# Patient Record
Sex: Female | Born: 1995 | Race: Black or African American | Hispanic: No | Marital: Single | Smoking: Never smoker
Health system: Southern US, Community
[De-identification: ages and names within clinical notes are randomized; demographics above are authoritative.]

## PROBLEM LIST (undated history)

## (undated) DIAGNOSIS — L509 Urticaria, unspecified: Secondary | ICD-10-CM

## (undated) HISTORY — DX: Urticaria, unspecified: L50.9

---
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
## 2015-03-03 ENCOUNTER — Ambulatory Visit (INDEPENDENT_AMBULATORY_CARE_PROVIDER_SITE_OTHER): Payer: 59 | Admitting: Certified Nurse Midwife

## 2015-03-03 ENCOUNTER — Encounter: Payer: Self-pay | Admitting: Certified Nurse Midwife

## 2015-03-03 VITALS — BP 115/72 | HR 76 | Temp 98.5°F | Ht 65.0 in | Wt 162.0 lb

## 2015-03-03 DIAGNOSIS — D649 Anemia, unspecified: Secondary | ICD-10-CM | POA: Insufficient documentation

## 2015-03-03 DIAGNOSIS — D500 Iron deficiency anemia secondary to blood loss (chronic): Secondary | ICD-10-CM

## 2015-03-03 DIAGNOSIS — N644 Mastodynia: Secondary | ICD-10-CM | POA: Diagnosis not present

## 2015-03-03 NOTE — Progress Notes (Signed)
Patient ID: Ashley Burch, female   DOB: [birth year], 19 y.o.   MRN: [id number]   Chief Complaint  Patient presents with  . Follow-up    problems/concerns    HPI Ashley Burch is a 19 y.o. female.  C/O breast tenderness and pruritis.  Had recently been to the beach with a sunburn and is currently peeling on right breast.   Declines STD screening exam.  Denies being currently sexually active.  Declines birth control at this time.  States periods last about 4 days denies dysmenorrhea or AUB.  HPI  History reviewed. No pertinent past medical history.  History reviewed. No pertinent past surgical history.  Family History  Problem Relation Age of Onset  . Cancer Maternal Grandmother   . Kidney disease Maternal Grandfather     Social History Social History  Substance Use Topics  . Smoking status: Never Smoker   . Smokeless tobacco: None  . Alcohol Use: No    No Known Allergies  Current Outpatient Prescriptions  Medication Sig Dispense Refill  . azithromycin (ZITHROMAX) 1 G powder Take 1 packet (1 g total) by mouth once. (Patient not taking: Reported on 09/04/2014) 1 each 0  . ibuprofen (ADVIL,MOTRIN) 200 MG tablet Take 200 mg by mouth every 6 (six) hours as needed for mild pain.    . naproxen (NAPROSYN) 500 MG tablet Take 1 tablet (500 mg total) by mouth 2 (two) times daily with a meal. (Patient not taking: Reported on 03/03/2015) 30 tablet 0   No current facility-administered medications for this visit.    Review of Systems Review of Systems Constitutional: negative for fatigue and weight loss Respiratory: negative for cough and wheezing Cardiovascular: negative for chest pain, fatigue and palpitations Gastrointestinal: negative for abdominal pain and change in bowel habits Genitourinary:negative Integument/breast: negative for nipple discharge Musculoskeletal:negative for myalgias Neurological: negative for gait problems and tremors Behavioral/Psych: negative for abusive  relationship, depression Endocrine: negative for temperature intolerance     Blood pressure 115/72, pulse 76, temperature 98.5 F (36.9 C), height  (1.651 m), weight 162 lb (73.483 kg), last menstrual period 02/23/2015.  Physical Exam Physical Exam General:   alert  Skin:   no rash or abnormalities  Lungs:   clear to auscultation bilaterally  Heart:   regular rate and rhythm, S1, S2 normal, no murmur, click, rub or gallop  Breasts:   normal without suspicious masses, skin or nipple changes or axillary nodes  Abdomen:  normal findings: no organomegaly, soft, non-tender and no hernia  Pelvis:  deferred    90% of 15 min visit spent on counseling and coordination of care.   Data Reviewed Previous medical hx, labs, meds  Assessment     Normal breast development Anemia     Plan   Professional bra fitting.   No orders of the defined types were placed in this encounter.   No orders of the defined types were placed in this encounter.    Follow up as needed or if desires birth control.

## 2015-05-26 ENCOUNTER — Encounter: Payer: Self-pay | Admitting: Certified Nurse Midwife

## 2015-05-26 ENCOUNTER — Ambulatory Visit (INDEPENDENT_AMBULATORY_CARE_PROVIDER_SITE_OTHER): Payer: 59 | Admitting: Certified Nurse Midwife

## 2015-05-26 VITALS — BP 121/68 | HR 77 | Temp 98.6°F | Wt 157.0 lb

## 2015-05-26 DIAGNOSIS — Z7251 High risk heterosexual behavior: Secondary | ICD-10-CM

## 2015-05-26 DIAGNOSIS — Z30011 Encounter for initial prescription of contraceptive pills: Secondary | ICD-10-CM

## 2015-05-26 DIAGNOSIS — Z3202 Encounter for pregnancy test, result negative: Secondary | ICD-10-CM | POA: Diagnosis not present

## 2015-05-26 DIAGNOSIS — N760 Acute vaginitis: Secondary | ICD-10-CM | POA: Diagnosis not present

## 2015-05-26 DIAGNOSIS — Z309 Encounter for contraceptive management, unspecified: Secondary | ICD-10-CM | POA: Insufficient documentation

## 2015-05-26 LAB — POCT URINE PREGNANCY: Preg Test, Ur: NEGATIVE

## 2015-05-26 LAB — POCT URINALYSIS DIPSTICK
Bilirubin, UA: NEGATIVE
Glucose, UA: NEGATIVE
Ketones, UA: NEGATIVE
Leukocytes, UA: NEGATIVE
Nitrite, UA: NEGATIVE
Protein, UA: NEGATIVE
Spec Grav, UA: 1.01
Urobilinogen, UA: NEGATIVE
pH, UA: 7

## 2015-05-26 MED ORDER — FLUCONAZOLE 100 MG PO TABS: 100.0000 mg | ORAL_TABLET | Freq: Every day | ORAL | Status: DC

## 2015-05-26 MED ORDER — TERCONAZOLE 0.8 % VA CREA: 1.0000 | TOPICAL_CREAM | Freq: Every day | VAGINAL | Status: DC

## 2015-05-26 MED ORDER — NORETHIN-ETH ESTRAD-FE BIPHAS 1 MG-10 MCG / 10 MCG PO TABS: 1.0000 | ORAL_TABLET | Freq: Every day | ORAL | Status: DC

## 2015-05-26 NOTE — Progress Notes (Signed)
Patient ID: Ashley Burch, female   DOB: 08/05/1995, 19 y.o.   MRN: [id number]  Chief Complaint  Patient presents with  . Vaginitis    patient complains vaginal itching x 2 days, new partner    HPI Ashley Burch is a 19 y.o. female.  States she has vaginal itching for several days.  Has not tried anything.  Desires treatment.  Is sexually active and is not using contraception.  Counseling given.  Desires to start contraception.  Has never been on contraception.  States that she can take pills and will remember.  Counseled that if that changes to let us know so that we can change forms of contraception.  Also, states that her breasts are not sore currently and is doing better since having a bra fitting.     HPI  No past medical history on file.  No past surgical history on file.  Family History  Problem Relation Age of Onset  . Cancer Maternal Grandmother   . Kidney disease Maternal Grandfather     Social History Social History  Substance Use Topics  . Smoking status: Never Smoker   . Smokeless tobacco: None  . Alcohol Use: No    No Known Allergies  Current Outpatient Prescriptions  Medication Sig Dispense Refill  . fluconazole (DIFLUCAN) 100 MG tablet Take 1 tablet (100 mg total) by mouth daily. 3 tablet 0  . ibuprofen (ADVIL,MOTRIN) 200 MG tablet Take 200 mg by mouth every 6 (six) hours as needed for mild pain.    . naproxen (NAPROSYN) 500 MG tablet Take 1 tablet (500 mg total) by mouth 2 (two) times daily with a meal. (Patient not taking: Reported on 03/03/2015) 30 tablet 0  . Norethindrone-Ethinyl Estradiol-Fe Biphas (LO LOESTRIN FE) 1 MG-10 MCG / 10 MCG tablet Take 1 tablet by mouth daily. 1 Package 11  . terconazole (TERAZOL 3) 0.8 % vaginal cream Place 1 applicator vaginally at bedtime. 20 g 0   No current facility-administered medications for this visit.    Review of Systems Review of Systems Constitutional: negative for fatigue and weight loss Respiratory: negative  for cough and wheezing Cardiovascular: negative for chest pain, fatigue and palpitations Gastrointestinal: negative for abdominal pain and change in bowel habits Genitourinary:negative Integument/breast: negative for nipple discharge Musculoskeletal:negative for myalgias Neurological: negative for gait problems and tremors Behavioral/Psych: negative for abusive relationship, depression Endocrine: negative for temperature intolerance     Blood pressure 121/68, pulse 77, temperature 98.6 F (37 C), temperature source Oral, weight 157 lb (71.215 kg), last menstrual period 05/19/2015.  Physical Exam Physical Exam General:   alert  Skin:   no rash or abnormalities  Lungs:   clear to auscultation bilaterally  Heart:   regular rate and rhythm, S1, S2 normal, no murmur, click, rub or gallop  Breasts:   deferred  Abdomen:  normal findings: no organomegaly, soft, non-tender and no hernia  Pelvis:  External genitalia: normal general appearance Urinary system: urethral meatus normal and bladder without fullness, nontender Vaginal: normal without tenderness, induration or masses Cervix: normal appearance Adnexa: normal bimanual exam Uterus: anteverted and non-tender, normal size    50% of 15 min visit spent on counseling and coordination of care.   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 POCT Urinalysis Dipstick  . POCT urine pregnancy   Meds  ordered this encounter  Medications  . Norethindrone-Ethinyl Estradiol-Fe Biphas (LO LOESTRIN FE) 1 MG-10 MCG / 10 MCG tablet    Sig: Take 1 tablet by mouth daily.    Dispense:  1 Package    Refill:  11  . fluconazole (DIFLUCAN) 100 MG tablet    Sig: Take 1 tablet (100 mg total) by mouth daily.    Dispense:  3 tablet    Refill:  0  . terconazole (TERAZOL 3) 0.8 % vaginal cream    Sig: Place 1  applicator vaginally at bedtime.    Dispense:  20 g    Refill:  0    Need to obtain previous records Possible management options include: LARK Follow up as in 3 months for contraception management.

## 2015-05-26 NOTE — Addendum Note (Signed)
Addended by: Marya LandryFOSTER, SUZANNE D on: 05/26/2015 04:57 PM   Modules accepted: Orders

## 2015-06-01 LAB — SURESWAB, VAGINOSIS/VAGINITIS PLUS
ATOPOBIUM VAGINAE: NOT DETECTED Log (cells/mL)
C. ALBICANS, DNA: DETECTED — AB
C. GLABRATA, DNA: NOT DETECTED
C. parapsilosis, DNA: NOT DETECTED
C. trachomatis RNA, TMA: NOT DETECTED
C. tropicalis, DNA: NOT DETECTED
GARDNERELLA VAGINALIS: 5.4 Log (cells/mL)
LACTOBACILLUS SPECIES: 7.4 Log (cells/mL)
MEGASPHAERA SPECIES: NOT DETECTED Log (cells/mL)
N. gonorrhoeae RNA, TMA: NOT DETECTED
T. VAGINALIS RNA, QL TMA: NOT DETECTED

## 2015-06-03 ENCOUNTER — Other Ambulatory Visit: Payer: Self-pay | Admitting: Certified Nurse Midwife

## 2015-07-15 ENCOUNTER — Telehealth: Payer: Self-pay | Admitting: *Deleted

## 2015-07-15 NOTE — Telephone Encounter (Signed)
Patient called- but did not leave reason for call 1:15 LM on VM- call on call if emergent- otherwise office will open Tuesday

## 2015-07-28 DIAGNOSIS — Z23 Encounter for immunization: Secondary | ICD-10-CM | POA: Diagnosis not present

## 2015-08-26 ENCOUNTER — Ambulatory Visit: Payer: 59 | Admitting: Certified Nurse Midwife

## 2015-09-14 ENCOUNTER — Emergency Department (HOSPITAL_COMMUNITY)
Admission: EM | Admit: 2015-09-14 | Discharge: 2015-09-14 | Disposition: A | Discharge status: Home / Self Care | Payer: 59 | Attending: Emergency Medicine | Admitting: Emergency Medicine

## 2015-09-14 ENCOUNTER — Emergency Department (HOSPITAL_COMMUNITY): Payer: 59

## 2015-09-14 ENCOUNTER — Encounter (HOSPITAL_COMMUNITY): Payer: Self-pay | Admitting: *Deleted

## 2015-09-14 DIAGNOSIS — Y9389 Activity, other specified: Secondary | ICD-10-CM | POA: Diagnosis not present

## 2015-09-14 DIAGNOSIS — S0083XA Contusion of other part of head, initial encounter: Secondary | ICD-10-CM | POA: Diagnosis not present

## 2015-09-14 DIAGNOSIS — Z3202 Encounter for pregnancy test, result negative: Secondary | ICD-10-CM | POA: Diagnosis not present

## 2015-09-14 DIAGNOSIS — S0081XA Abrasion of other part of head, initial encounter: Secondary | ICD-10-CM | POA: Insufficient documentation

## 2015-09-14 DIAGNOSIS — M542 Cervicalgia: Secondary | ICD-10-CM

## 2015-09-14 DIAGNOSIS — S199XXA Unspecified injury of neck, initial encounter: Secondary | ICD-10-CM | POA: Diagnosis not present

## 2015-09-14 DIAGNOSIS — R519 Headache, unspecified: Secondary | ICD-10-CM

## 2015-09-14 DIAGNOSIS — Z793 Long term (current) use of hormonal contraceptives: Secondary | ICD-10-CM | POA: Insufficient documentation

## 2015-09-14 DIAGNOSIS — Y92410 Unspecified street and highway as the place of occurrence of the external cause: Secondary | ICD-10-CM | POA: Diagnosis not present

## 2015-09-14 DIAGNOSIS — Z79899 Other long term (current) drug therapy: Secondary | ICD-10-CM | POA: Diagnosis not present

## 2015-09-14 DIAGNOSIS — R51 Headache: Secondary | ICD-10-CM

## 2015-09-14 DIAGNOSIS — S0990XA Unspecified injury of head, initial encounter: Secondary | ICD-10-CM | POA: Diagnosis not present

## 2015-09-14 DIAGNOSIS — Y998 Other external cause status: Secondary | ICD-10-CM | POA: Diagnosis not present

## 2015-09-14 LAB — POC URINE PREG, ED: PREG TEST UR: NEGATIVE

## 2015-09-14 MED ORDER — GI COCKTAIL ~~LOC~~: 30.0000 mL | Freq: Once | ORAL | Status: DC

## 2015-09-14 MED ORDER — METHOCARBAMOL 500 MG PO TABS: 500.0000 mg | ORAL_TABLET | Freq: Two times a day (BID) | ORAL | Status: DC

## 2015-09-14 MED ORDER — ACETAMINOPHEN 325 MG PO TABS
650.0000 mg | ORAL_TABLET | Freq: Once | ORAL | Status: AC
  Administered 2015-09-14: 650 mg via ORAL
  Filled 2015-09-14: qty 2

## 2015-09-14 NOTE — ED Notes (Signed)
Pt stable, ambulatory, states understanding of discharge instructions 
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
## 2015-09-14 NOTE — ED Notes (Signed)
Pt is neurologically intact.

## 2015-10-27 ENCOUNTER — Ambulatory Visit (INDEPENDENT_AMBULATORY_CARE_PROVIDER_SITE_OTHER): Payer: 59 | Admitting: Certified Nurse Midwife

## 2015-10-27 ENCOUNTER — Encounter: Payer: Self-pay | Admitting: Certified Nurse Midwife

## 2015-10-27 VITALS — BP 118/81 | HR 94 | Wt 152.0 lb

## 2015-10-27 DIAGNOSIS — N760 Acute vaginitis: Secondary | ICD-10-CM

## 2015-10-27 DIAGNOSIS — Z3041 Encounter for surveillance of contraceptive pills: Secondary | ICD-10-CM | POA: Diagnosis not present

## 2015-10-27 DIAGNOSIS — Z7251 High risk heterosexual behavior: Secondary | ICD-10-CM

## 2015-10-27 DIAGNOSIS — Z711 Person with feared health complaint in whom no diagnosis is made: Secondary | ICD-10-CM

## 2015-10-27 DIAGNOSIS — N949 Unspecified condition associated with female genital organs and menstrual cycle: Secondary | ICD-10-CM | POA: Diagnosis not present

## 2015-10-27 DIAGNOSIS — N898 Other specified noninflammatory disorders of vagina: Secondary | ICD-10-CM | POA: Diagnosis not present

## 2015-10-27 MED ORDER — FLUCONAZOLE 100 MG PO TABS: 100.0000 mg | ORAL_TABLET | Freq: Once | ORAL | Status: DC

## 2015-10-27 MED ORDER — NORETHIN-ETH ESTRAD-FE BIPHAS 1 MG-10 MCG / 10 MCG PO TABS: 1.0000 | ORAL_TABLET | Freq: Every day | ORAL | Status: DC

## 2015-10-27 MED ORDER — VALACYCLOVIR HCL 1 G PO TABS: 1000.0000 mg | ORAL_TABLET | Freq: Two times a day (BID) | ORAL | Status: DC

## 2015-10-27 NOTE — Patient Instructions (Signed)
Herpes Simplex Test There are two common types of herpes simplex virus (HSV). These are classified as Type 1 (HSV1) or Type 2 (HSV2). Type 1 often causes cold sores on or around the mouth and sometimes on or around the eyes. Type 2 is commonly known as a sexually transmitted infection that causes sores in and around the genitals. Both types of herpes simplex can cause sores in different areas. There are two types of herpes simplex tests. These include:  Culture. This consists of collecting and testing a sample of fluid with a cotton swab from an open sore. This can only be done during an active infection (outbreak). Culture tests take several days to complete but are very accurate.  HSV blood tests. This test is not as accurate as a culture. However, HSV blood tests are faster than cultures, often providing a test result within one day.  HSV antibody test. This checks for the presence of antibodies against HSV in your blood. Antibodies are proteins your body makes to help fight infection.  HSV antigen test. This checks for the presence of the HSV germ (antigen) in your blood. Your health care provider may recommend you have a HSV test if:  He or she believes you have a HSV infection.  You have a weakened immune system (immunocompromised) and you have sores around your mouth or genitals that look like HSV eruptions.  You have a fever of unknown origin (FUO).  You are pregnant, have herpes, and are expecting to deliver a baby vaginally in the next 6-8 weeks. RESULTS It is your responsibility to obtain your test results. Ask the lab or department performing the test when and how you will get your results. Contact your health care provider to discuss any questions you have about your results. Range of Normal Values Ranges for normal values may vary among different labs and hospitals. You should always check with your health care provider after having lab work or other tests done to discuss whether  your values are considered within normal limits. Normal findings include:  No HSV antigen or antibodies present in your blood.  No HSV antigen present in cultured fluid. Meaning of Results Outside Normal Ranges The following test results may indicate that you have an active HSV infection:  Positive culture for HSV1 or HSV2.  Presence of HSV1 or HSV2 antigens in your blood.  Presence of certain HSV1 or HSV2 antibodies (IgM) in your blood. Discuss your test results with your health care provider. He or she will use the results to make a diagnosis and determine a treatment plan that is right for you.   This information is not intended to replace advice given to you by your health care provider. Make sure you discuss any questions you have with your health care provider.   Document Released: 08/11/2004 Document Revised: 07/30/2014 Document Reviewed: 11/24/2013 Elsevier Interactive Patient Education 2016 [employer] Oral Contraception Information Oral contraceptive pills (OCPs) are medicines taken to prevent pregnancy. OCPs work by preventing the ovaries from releasing eggs. The hormones in OCPs also cause the cervical mucus to thicken, preventing the sperm from entering the uterus. The hormones also cause the uterine lining to become thin, not allowing a fertilized egg to attach to the inside of the uterus. OCPs are highly effective when taken exactly as prescribed. However, OCPs do not prevent sexually transmitted diseases (STDs). Safe sex practices, such as using condoms along with the pill, can help prevent STDs.  Before taking the pill, you may  have a physical exam and Pap test. Your health care provider may order blood tests. The health care provider will make sure you are a good candidate for oral contraception. Discuss with your health care provider the possible side effects of the OCP you may be prescribed. When starting an OCP, it can take 2 to 3 months for the body to adjust to the  changes in hormone levels in your body.  TYPES OF ORAL CONTRACEPTION  The combination pill--This pill contains estrogen and progestin (synthetic progesterone) hormones. The combination pill comes in 21-day, 28-day, or 91-day packs. Some types of combination pills are meant to be taken continuously (365-day pills). With 21-day packs, you do not take pills for 7 days after the last pill. With 28-day packs, the pill is taken every day. The last 7 pills are without hormones. Certain types of pills have more than 21 hormone-containing pills. With 91-day packs, the first 84 pills contain both hormones, and the last 7 pills contain no hormones or contain estrogen only.  The minipill--This pill contains the progesterone hormone only. The pill is taken every day continuously. It is very important to take the pill at the same time each day. The minipill comes in packs of 28 pills. All 28 pills contain the hormone.  ADVANTAGES OF ORAL CONTRACEPTIVE PILLS  Decreases premenstrual symptoms.   Treats menstrual period cramps.   Regulates the menstrual cycle.   Decreases a heavy menstrual flow.   May treatacne, depending on the type of pill.   Treats abnormal uterine bleeding.   Treats polycystic ovarian syndrome.   Treats endometriosis.   Can be used as emergency contraception.  THINGS THAT CAN MAKE ORAL CONTRACEPTIVE PILLS LESS EFFECTIVE OCPs can be less effective if:   You forget to take the pill at the same time every day.   You have a stomach or intestinal disease that lessens the absorption of the pill.   You take OCPs with other medicines that make OCPs less effective, such as antibiotics, certain HIV medicines, and some seizure medicines.   You take expired OCPs.   You forget to restart the pill on day 7, when using the packs of 21 pills.  RISKS ASSOCIATED WITH ORAL CONTRACEPTIVE PILLS  Oral contraceptive pills can sometimes cause side effects, such  as:  Headache.  Nausea.  Breast tenderness.  Irregular bleeding or spotting. Combination pills are also associated with a small increased risk of:  Blood clots.  Heart attack.  Stroke.   This information is not intended to replace advice given to you by your health care provider. Make sure you discuss any questions you have with your health care provider.   Document Released: 09/29/2002 Document Revised: 04/29/2013 Document Reviewed: 12/28/2012 Elsevier Interactive Patient Education 2016 [employer]. Safe Sex Safe sex is about reducing the risk of giving or getting a sexually transmitted disease (STD). STDs are spread through sexual contact involving the genitals, mouth, or rectum. Some STDs can be cured and others cannot. Safe sex can also prevent unintended pregnancies.  WHAT ARE SOME SAFE SEX PRACTICES?  Limit your sexual activity to only one partner who is having sex with only you.  Talk to your partner about his or her past partners, past STDs, and drug use.  Use a condom every time you have sexual intercourse. This includes vaginal, oral, and anal sexual activity. Both females and males should wear condoms during oral sex. Only use latex or polyurethane condoms and water-based lubricants. Using petroleum-based lubricants or  oils to lubricate a condom will weaken the condom and increase the chance that it will break. The condom should be in place from the beginning to the end of sexual activity. Wearing a condom reduces, but does not completely eliminate, your risk of getting or giving an STD. STDs can be spread by contact with infected body fluids and skin.  Get vaccinated for hepatitis B and HPV.  Avoid alcohol and recreational drugs, which can affect your judgment. You may forget to use a condom or participate in high-risk sex.  For females, avoid douching after sexual intercourse. Douching can spread an infection farther into the reproductive tract.  Check your body  for signs of sores, blisters, rashes, or unusual discharge. See your health care provider if you notice any of these signs.  Avoid sexual contact if you have symptoms of an infection or are being treated for an STD. If you or your partner has herpes, avoid sexual contact when blisters are present. Use condoms at all other times.  If you are at risk of being infected with HIV, it is recommended that you take a prescription medicine daily to prevent HIV infection. This is called pre-exposure prophylaxis (PrEP). You are considered at risk if:  You are a man who has sex with other men (MSM).  You are a heterosexual man or woman who is sexually active with more than one partner.  You take drugs by injection.  You are sexually active with a partner who has HIV.  Talk with your health care provider about whether you are at high risk of being infected with HIV. If you choose to begin PrEP, you should first be tested for HIV. You should then be tested every 3 months for as long as you are taking PrEP.  See your health care provider for regular screenings, exams, and tests for other STDs. Before having sex with a new partner, each of you should be screened for STDs and should talk about the results with each other. WHAT ARE THE BENEFITS OF SAFE SEX?   There is less chance of getting or giving an STD.  You can prevent unwanted or unintended pregnancies.  By discussing safe sex concerns with your partner, you may increase feelings of intimacy, comfort, trust, and honesty between the two of you.   This information is not intended to replace advice given to you by your health care provider. Make sure you discuss any questions you have with your health care provider.   Document Released: 08/16/2004 Document Revised: 07/30/2014 Document Reviewed: 12/31/2011 Elsevier Interactive Patient Education [employer]

## 2015-10-27 NOTE — Addendum Note (Signed)
Addended by: Marya LandryFOSTER, SUZANNE D on: 10/27/2015 02:16 PM   Modules accepted: Orders

## 2015-10-27 NOTE — Progress Notes (Signed)
Patient ID: Ashley Burch, female   DOB: [birth year], 20 y.o.   MRN: [id number]  Chief Complaint  Patient presents with  . Gynecologic Exam    bumps on vaginal area x 2 days    HPI Ashley Burch is a 20 y.o. female.  States that the sores appereard two days ago, very painful, hard to sit.  Tender to palpation, oozing ulcers.  Does have puritis. Has not had this issue before.  Denies any hx of herpes.  Boyfriend present for exam, he has a cold sore currently.  Discussed sexual transmission of Herpes in detail.  Encouraged boyfriend to have STD screening performed also.  Encouraged safe sex practices.  Discussed ways to prevent transmission.  Is currently sexually active, not currently taking her OCPs, states that she ran out.  Was taking them on a daily basis.  Is not using condoms presently.  Desires to continue with LoLo.    HPI  No past medical history on file.  No past surgical history on file.  Family History  Problem Relation Age of Onset  . Cancer Maternal Grandmother   . Kidney disease Maternal Grandfather     Social History Social History  Substance Use Topics  . Smoking status: Never Smoker   . Smokeless tobacco: Not on file  . Alcohol Use: No    No Known Allergies  Current Outpatient Prescriptions  Medication Sig Dispense Refill  . ibuprofen (ADVIL,MOTRIN) 200 MG tablet Take 200 mg by mouth every 6 (six) hours as needed for mild pain.    . methocarbamol (ROBAXIN) 500 MG tablet Take 1 tablet (500 mg total) by mouth 2 (two) times daily. (Patient not taking: Reported on 10/27/2015) 10 tablet 0  . naproxen (NAPROSYN) 500 MG tablet Take 1 tablet (500 mg total) by mouth 2 (two) times daily with a meal. (Patient not taking: Reported on 03/03/2015) 30 tablet 0  . Norethindrone-Ethinyl Estradiol-Fe Biphas (LO LOESTRIN FE) 1 MG-10 MCG / 10 MCG tablet Take 1 tablet by mouth daily. 1 Package 11  . valACYclovir (VALTREX) 1000 MG tablet Take 1 tablet (1,000 mg total) by mouth 2 (two)  times daily. 30 tablet PRN   No current facility-administered medications for this visit.    Review of Systems Review of Systems Constitutional: negative for fatigue and weight loss Respiratory: negative for cough and wheezing Cardiovascular: negative for chest pain, fatigue and palpitations Gastrointestinal: negative for abdominal pain and change in bowel habits Genitourinary: + labial/vaginal leisions Integument/breast: negative for nipple discharge Musculoskeletal:negative for myalgias Neurological: negative for gait problems and tremors Behavioral/Psych: negative for abusive relationship, depression Endocrine: negative for temperature intolerance     Blood pressure 118/81, pulse 94, weight 152 lb (68.947 kg), last menstrual period 10/10/2015.  Physical Exam Physical Exam General:   alert  Skin:   no rash or abnormalities  Lungs:   clear to auscultation bilaterally  Heart:   regular rate and rhythm, S1, S2 normal, no murmur, click, rub or gallop  Breasts:   deferred  Abdomen:  normal findings: no organomegaly, soft, non-tender and no hernia  Pelvis:  External genitalia: normal general appearance, + multiple ulcerations oozing leisions about 2 mm in size along external labia and the introitus, erythemic Urinary system: urethral meatus normal and bladder without fullness, nontender Vaginal: normal without tenderness, induration or masses, + white chunky discharge noted    50% of 25 min visit spent on counseling and coordination of care.   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 HIV antibody (with reflex)  . Hepatitis B surface antigen  . RPR  . Hepatitis C antibody   Meds ordered this encounter  Medications  . Norethindrone-Ethinyl Estradiol-Fe  Biphas (LO LOESTRIN FE) 1 MG-10 MCG / 10 MCG tablet    Sig: Take 1 tablet by mouth daily.    Dispense:  1 Package    Refill:  11  . valACYclovir (VALTREX) 1000 MG tablet    Sig: Take 1 tablet (1,000 mg total) by mouth 2 (two) times daily.    Dispense:  30 tablet    Refill:  PRN      Follow up as needed.

## 2015-10-28 LAB — HIV ANTIBODY (ROUTINE TESTING W REFLEX): HIV SCREEN 4TH GENERATION: NONREACTIVE

## 2015-10-28 LAB — RPR: RPR: NONREACTIVE

## 2015-10-28 LAB — HEPATITIS B SURFACE ANTIGEN: Hepatitis B Surface Ag: NEGATIVE

## 2015-10-28 LAB — HEPATITIS C ANTIBODY

## 2015-10-30 ENCOUNTER — Encounter: Payer: Self-pay | Admitting: Certified Nurse Midwife

## 2015-10-31 ENCOUNTER — Other Ambulatory Visit: Payer: Self-pay | Admitting: Certified Nurse Midwife

## 2015-10-31 LAB — NUSWAB VAGINITIS PLUS (VG+)
CANDIDA ALBICANS, NAA: POSITIVE — AB
Candida glabrata, NAA: NEGATIVE
Chlamydia trachomatis, NAA: NEGATIVE
NEISSERIA GONORRHOEAE, NAA: NEGATIVE
Trich vag by NAA: NEGATIVE

## 2015-11-01 ENCOUNTER — Other Ambulatory Visit: Payer: Self-pay | Admitting: Certified Nurse Midwife

## 2015-11-01 DIAGNOSIS — A6009 Herpesviral infection of other urogenital tract: Secondary | ICD-10-CM | POA: Insufficient documentation

## 2015-11-01 DIAGNOSIS — N760 Acute vaginitis: Secondary | ICD-10-CM

## 2015-11-01 LAB — HERPES SIMPLEX VIRUS CULTURE

## 2015-11-01 MED ORDER — VALACYCLOVIR HCL 500 MG PO TABS: 500.0000 mg | ORAL_TABLET | Freq: Every day | ORAL | Status: DC

## 2015-11-01 MED ORDER — IBUPROFEN 800 MG PO TABS: 800.0000 mg | ORAL_TABLET | Freq: Three times a day (TID) | ORAL | Status: DC | PRN

## 2015-11-01 MED ORDER — TERCONAZOLE 0.4 % VA CREA: 1.0000 | TOPICAL_CREAM | Freq: Every day | VAGINAL | Status: DC

## 2016-03-14 ENCOUNTER — Encounter: Payer: Self-pay | Admitting: Certified Nurse Midwife

## 2016-03-16 ENCOUNTER — Other Ambulatory Visit: Payer: Self-pay | Admitting: *Deleted

## 2016-03-16 DIAGNOSIS — A6009 Herpesviral infection of other urogenital tract: Secondary | ICD-10-CM

## 2016-03-16 MED ORDER — VALACYCLOVIR HCL 500 MG PO TABS: 500.0000 mg | ORAL_TABLET | Freq: Every day | ORAL | 4 refills | Status: DC

## 2016-06-21 ENCOUNTER — Other Ambulatory Visit: Payer: Self-pay | Admitting: Certified Nurse Midwife

## 2016-06-21 ENCOUNTER — Telehealth: Payer: Self-pay | Admitting: *Deleted

## 2016-06-21 DIAGNOSIS — Z3041 Encounter for surveillance of contraceptive pills: Secondary | ICD-10-CM

## 2016-06-21 MED ORDER — NORETHIN-ETH ESTRAD-FE BIPHAS 1 MG-10 MCG / 10 MCG PO TABS: 1.0000 | ORAL_TABLET | Freq: Every day | ORAL | 4 refills | Status: DC

## 2016-06-21 NOTE — Telephone Encounter (Signed)
Refill sent.  Thank you.  R.Tessy Pawelski CNM 

## 2016-06-21 NOTE — Telephone Encounter (Signed)
Fax received from pharmacy for 90 day supply of Lo Loestrin.  Please send to pharmacy if approved for 90 day fill.

## 2016-09-27 ENCOUNTER — Encounter: Payer: Self-pay | Admitting: Obstetrics & Gynecology

## 2016-09-27 ENCOUNTER — Ambulatory Visit (INDEPENDENT_AMBULATORY_CARE_PROVIDER_SITE_OTHER): Payer: Self-pay | Admitting: Obstetrics & Gynecology

## 2016-09-27 VITALS — BP 116/74 | HR 98 | Wt 158.0 lb

## 2016-09-27 DIAGNOSIS — Z113 Encounter for screening for infections with a predominantly sexual mode of transmission: Secondary | ICD-10-CM

## 2016-09-27 DIAGNOSIS — N898 Other specified noninflammatory disorders of vagina: Secondary | ICD-10-CM

## 2016-09-27 NOTE — Progress Notes (Signed)
   GYNECOLOGY OFFICE VISIT NOTE  History:  21 y.o. F here today for vaginal discharge and odor for a month now. She denies any abnormal vaginal bleeding, pelvic pain or other concerns.   No past medical history on file.  No past surgical history on file.  The following portions of the patient's history were reviewed and updated as appropriate: allergies, current medications, past family history, past medical history, past social history, past surgical history and problem list.    Review of Systems:  Pertinent items noted in HPI and remainder of comprehensive ROS otherwise negative.   Objective:  Physical Exam BP 116/74   Pulse 98   Wt 158 lb (71.7 kg)   LMP 09/10/2016 (Exact Date)   BMI 26.29 kg/m  CONSTITUTIONAL: Well-developed, well-nourished female in no acute distress.  HENT:  Normocephalic, atraumatic. External right and left ear normal. Oropharynx is clear and moist EYES: Conjunctivae and EOM are normal. Pupils are equal, round, and reactive to light. No scleral icterus.  NECK: Normal range of motion, supple, no masses SKIN: Skin is warm and dry. No rash noted. Not diaphoretic. No erythema. No pallor. NEUROLOGIC: Alert and oriented to person, place, and time. Normal reflexes, muscle tone coordination. No cranial nerve deficit noted. PSYCHIATRIC: Normal mood and affect. Normal behavior. Normal judgment and thought content. CARDIOVASCULAR: Normal heart rate noted RESPIRATORY: Effort and breath sounds normal, no problems with respiration noted ABDOMEN: Soft, no distention noted.   PELVIC: Normal appearing external genitalia; normal appearing vaginal mucosa and cervix. Scant discharge noted; testing sample obtained.  Normal uterine size, no other palpable masses, no uterine or adnexal tenderness. MUSCULOSKELETAL: Normal range of motion. No edema noted.  Assessment & Plan:  1. Vaginal discharge Cervicovaginal ancillary only done, will follow up results and manage  accordingly.  Routine preventative health maintenance measures emphasized. Please refer to After Visit Summary for other counseling recommendations.   Return if symptoms worsen or fail to improve.   Total face-to-face time with patient: 15 minutes. Over 50% of encounter was spent on counseling and coordination of care.   Jaynie CollinsUGONNA  Hiliana Eilts, MD, FACOG Attending Obstetrician & Gynecologist, [facility], [facility]

## 2016-09-27 NOTE — Patient Instructions (Signed)
Return to clinic for any scheduled appointments or for any gynecologic concerns as needed.     Vaginitis Vaginitis is an inflammation of the vagina. It is most often caused by a change in the normal balance of the bacteria and yeast that live in the vagina. This change in balance causes an overgrowth of certain bacteria or yeast, which causes the inflammation. There are different types of vaginitis, but the most common types are:  Bacterial vaginosis.  Yeast infection (candidiasis).  Trichomoniasis vaginitis. This is a sexually transmitted infection (STI).  Viral vaginitis.  Atrophic vaginitis.  Allergic vaginitis. What are the causes? The cause depends on the type of vaginitis. Vaginitis can be caused by:  Bacteria (bacterial vaginosis).  Yeast (yeast infection).  A parasite (trichomoniasis vaginitis)  A virus (viral vaginitis).  Low hormone levels (atrophic vaginitis). Low hormone levels can occur during pregnancy, breastfeeding, or after menopause.  Irritants, such as bubble baths, scented tampons, and feminine sprays (allergic vaginitis). Other factors can change the normal balance of the yeast and bacteria that live in the vagina. These include:  Antibiotic medicines.  Poor hygiene.  Diaphragms, vaginal sponges, spermicides, birth control pills, and intrauterine devices (IUD).  Sexual intercourse.  Infection.  Uncontrolled diabetes.  A weakened immune system. What are the signs or symptoms? Symptoms can vary depending on the cause of the vaginitis. Common symptoms include:  Abnormal vaginal discharge.  The discharge is white, gray, or yellow with bacterial vaginosis.  The discharge is thick, white, and cheesy with a yeast infection.  The discharge is frothy and yellow or greenish with trichomoniasis.  A bad vaginal odor.  The odor is fishy with bacterial vaginosis.  Vaginal itching, pain, or swelling.  Painful intercourse.  Pain or burning when  urinating. Sometimes there are no symptoms. How is this treated? Treatment will vary depending on the type of infection.  Bacterial vaginosis and trichomoniasis are often treated with antibiotic creams or pills.  Yeast infections are often treated with antifungal medicines, such as vaginal creams or suppositories.  Viral vaginitis has no cure, but symptoms can be treated with medicines that relieve discomfort. Your sexual partner should be treated as well.  Atrophic vaginitis may be treated with an estrogen cream, pill, suppository, or vaginal ring. If vaginal dryness occurs, lubricants and moisturizing creams may help. You may be told to avoid scented soaps, sprays, or douches.  Allergic vaginitis treatment involves quitting the use of the product that is causing the problem. Vaginal creams can be used to treat the symptoms. Follow these instructions at home:  Take all medicines as directed by your caregiver.  Keep your genital area clean and dry. Avoid soap and only rinse the area with water.  Avoid douching. It can remove the healthy bacteria in the vagina.  Do not use tampons or have sexual intercourse until your vaginitis has been treated. Use sanitary pads while you have vaginitis.  Wipe from front to back. This avoids the spread of bacteria from the rectum to the vagina.  Let air reach your genital area. ? Wear cotton underwear to decrease moisture buildup.  Avoid wearing underwear while you sleep until your vaginitis is gone.  Avoid tight pants and underwear or nylons without a cotton panel.  Take off wet clothing (especially bathing suits) as soon as possible.  Use mild, non-scented products. Avoid using irritants, such as:  Scented feminine sprays.  Fabric softeners.  Scented detergents.  Scented tampons.  Scented soaps or bubble baths.  Practice safe  sex and use condoms. Condoms may prevent the spread of trichomoniasis and viral vaginitis. Contact a health  care provider if:  You have abdominal pain.  You have symptoms that last for more than 2-3 days.  You have a fever and your symptoms suddenly get worse. This information is not intended to replace advice given to you by your health care provider. Make sure you discuss any questions you have with your health care provider. Document Released: 05/06/2007 Document Revised: 05/30/2016 Document Reviewed: 05/30/2016 Elsevier Interactive Patient Education  2017 [employer].

## 2016-09-28 LAB — CERVICOVAGINAL ANCILLARY ONLY
Bacterial vaginitis: NEGATIVE
CHLAMYDIA, DNA PROBE: NEGATIVE
Candida vaginitis: NEGATIVE
Neisseria Gonorrhea: NEGATIVE
TRICH (WINDOWPATH): NEGATIVE

## 2017-05-22 ENCOUNTER — Encounter (HOSPITAL_COMMUNITY): Payer: Self-pay | Admitting: Emergency Medicine

## 2017-05-22 ENCOUNTER — Ambulatory Visit (HOSPITAL_COMMUNITY)
Admission: EM | Admit: 2017-05-22 | Discharge: 2017-05-22 | Disposition: A | Discharge status: Home / Self Care | Payer: BLUE CROSS/BLUE SHIELD | Attending: Family Medicine | Admitting: Family Medicine

## 2017-05-22 ENCOUNTER — Ambulatory Visit (INDEPENDENT_AMBULATORY_CARE_PROVIDER_SITE_OTHER): Payer: BLUE CROSS/BLUE SHIELD

## 2017-05-22 DIAGNOSIS — S82839A Other fracture of upper and lower end of unspecified fibula, initial encounter for closed fracture: Secondary | ICD-10-CM | POA: Diagnosis not present

## 2017-05-22 DIAGNOSIS — S82832A Other fracture of upper and lower end of left fibula, initial encounter for closed fracture: Secondary | ICD-10-CM

## 2017-05-22 DIAGNOSIS — M25572 Pain in left ankle and joints of left foot: Secondary | ICD-10-CM

## 2017-05-22 NOTE — ED Provider Notes (Signed)
[facility]    CSN: [id number] Arrival date & time: 05/22/17  1503     History   Chief Complaint Chief Complaint  Patient presents with  . Ankle Pain    HPI Ashley Burch is a 21 y.o. female.   21 year old female states she was at school today descending stairs and she twisted her left ankle. She did not fall. Complaining of pain primarily to the medial and lateral aspect of the ankle where there is swelling and tenderness. Denies other injury.      History reviewed. No pertinent past medical history.  Patient Active Problem List   Diagnosis Date Noted  . Herpes genitalis in women 11/01/2015  . Vaginal lesion 10/27/2015  . Contraception management 05/26/2015  . High risk sexual behavior 05/26/2015  . Vaginitis 05/26/2015  . Anemia 03/03/2015    History reviewed. No pertinent surgical history.  OB History    No data available       Home Medications    Prior to Admission medications   Medication Sig Start Date End Date Taking? Authorizing Provider  valACYclovir (VALTREX) 500 MG tablet Take 1 tablet (500 mg total) by mouth daily. 03/16/16   Roe Coombsenney, Rachelle A, CNM    Family History Family History  Problem Relation Age of Onset  . Cancer Maternal Grandmother   . Kidney disease Maternal Grandfather     Social History Social History  Substance Use Topics  . Smoking status: Never Smoker  . Smokeless tobacco: Never Used  . Alcohol use No     Allergies   Patient has no known allergies.   Review of Systems Review of Systems  Constitutional: Negative for activity change, chills and fever.  HENT: Negative.   Respiratory: Negative.   Musculoskeletal:       As per HPI  Skin: Negative for color change, pallor and rash.  Neurological: Negative.   All other systems reviewed and are negative.    Physical Exam Triage Vital Signs ED Triage Vitals  Enc Vitals Group     BP 05/22/17 1517 129/82     Pulse Rate 05/22/17 1517 91     Resp  05/22/17 1517 18     Temp 05/22/17 1517 98.3 F (36.8 C)     Temp Source 05/22/17 1517 Oral     SpO2 05/22/17 1517 100 %     Weight --      Height --      Head Circumference --      Peak Flow --      Pain Score 05/22/17 1518 10     Pain Loc --      Pain Edu? --      Excl. in GC? --    No data found.   Updated Vital Signs BP 129/82 (BP Location: Right Arm)   Pulse 91   Temp 98.3 F (36.8 C) (Oral)   Resp 18   LMP 05/14/2017 (Exact Date)   SpO2 100%   Visual Acuity Right Eye Distance:   Left Eye Distance:   Bilateral Distance:    Right Eye Near:   Left Eye Near:    Bilateral Near:     Physical Exam  Constitutional: She is oriented to person, place, and time. She appears well-developed and well-nourished. No distress.  HENT:  Head: Normocephalic and atraumatic.  Eyes: EOM are normal.  Neck: Normal range of motion. Neck supple.  Pulmonary/Chest: Effort normal.  Musculoskeletal: She exhibits edema and tenderness. She exhibits no deformity.  Bimalleolar tenderness. Swelling to surrounding malleolar soft tissue. Limited range of motion due to pain. Minor tenderness to the dorsum of the proximal foot. No swelling or deformity to the foot. Distal neurovascular and sensory grossly intact. Pedal pulses 1+. Normal warmth and color.  Neurological: She is alert and oriented to person, place, and time. No cranial nerve deficit.  Skin: Skin is warm and dry.  Psychiatric: She has a normal mood and affect.  Nursing note and vitals reviewed.    UC Treatments / Results  Labs (all labs ordered are listed, but only abnormal results are displayed) Labs Reviewed - No data to display  EKG  EKG Interpretation None       Radiology Dg Ankle Complete Left  Result Date: 05/22/2017 CLINICAL DATA:  Inversion injury 2 hours ago.  Pain and swelling. EXAM: LEFT ANKLE COMPLETE - 3+ VIEW COMPARISON:  None. FINDINGS: Lateral soft tissue swelling. Tiny avulsion fracture at the tip of the  fibula. Tiny bone density at the base of the fifth metatarsal could also represent a tiny avulsion. IMPRESSION: Lateral soft tissue swelling. Tiny avulsion fracture of the tip of the fibula. Possible tiny avulsion fracture at the base of the fifth metatarsal as well. Electronically Signed   By: Paulina Fusi M.D.   On: 05/22/2017 15:41    Procedures Procedures (including critical care time)  Medications Ordered in UC Medications - No data to display   Initial Impression / Assessment and Plan / UC Course  I have reviewed the triage vital signs and the nursing notes.  Pertinent labs & imaging results that were available during my care of the patient were reviewed by me and considered in my medical decision making (see chart for details).       Final Clinical Impressions(s) / UC Diagnoses   Final diagnoses:  Acute left ankle pain  Avulsion fracture of distal fibula    New Prescriptions Current Discharge Medication List       Controlled Substance Prescriptions  Controlled Substance Registry consulted? Not Applicable   Hayden Rasmussen, NP 05/22/17 [phone]

## 2017-05-22 NOTE — Discharge Instructions (Signed)
Rest, ice, elevation and wear the wrap for at least 7-10 days. He may take it off periodically as needed. Do not place weight on your ankle for at least 3-5 days. If you notice there is still pain at that time replacing some weight and decrease amount of weight applied. Use crutches. No jumping, running type activity for at least 2-1/2-3 weeks. The orthopedic on-call is listed on this page him a call for follow-up appointment as needed.

## 2017-05-22 NOTE — ED Notes (Signed)
Pt given ice pack for comfort.  

## 2017-05-22 NOTE — ED Triage Notes (Signed)
Pt here with left ankle pain and swelling after twisting today

## 2017-05-31 ENCOUNTER — Ambulatory Visit (INDEPENDENT_AMBULATORY_CARE_PROVIDER_SITE_OTHER): Payer: BLUE CROSS/BLUE SHIELD | Admitting: Obstetrics

## 2017-05-31 ENCOUNTER — Encounter: Payer: Self-pay | Admitting: Obstetrics

## 2017-05-31 VITALS — BP 136/86 | Ht 65.0 in | Wt 157.0 lb

## 2017-05-31 DIAGNOSIS — Z113 Encounter for screening for infections with a predominantly sexual mode of transmission: Secondary | ICD-10-CM

## 2017-05-31 DIAGNOSIS — N898 Other specified noninflammatory disorders of vagina: Secondary | ICD-10-CM | POA: Diagnosis not present

## 2017-05-31 DIAGNOSIS — Z3041 Encounter for surveillance of contraceptive pills: Secondary | ICD-10-CM

## 2017-05-31 DIAGNOSIS — Z01419 Encounter for gynecological examination (general) (routine) without abnormal findings: Secondary | ICD-10-CM | POA: Diagnosis not present

## 2017-05-31 DIAGNOSIS — Z124 Encounter for screening for malignant neoplasm of cervix: Secondary | ICD-10-CM

## 2017-05-31 DIAGNOSIS — N926 Irregular menstruation, unspecified: Secondary | ICD-10-CM

## 2017-05-31 DIAGNOSIS — Z3202 Encounter for pregnancy test, result negative: Secondary | ICD-10-CM

## 2017-05-31 LAB — POCT URINE PREGNANCY: Preg Test, Ur: NEGATIVE

## 2017-05-31 MED ORDER — NORETHIN-ETH ESTRAD-FE BIPHAS 1 MG-10 MCG / 10 MCG PO TABS: 1.0000 | ORAL_TABLET | Freq: Every day | ORAL | 11 refills | Status: AC

## 2017-05-31 NOTE — Progress Notes (Signed)
Subjective:        Eugene GarnetKeira S Kelliher is a 21 y.o. female here for a routine exam.  Current complaints: Vaginal discharge without odor or irritation.    Personal health questionnaire:  Is patient Ashkenazi Jewish, have a family history of breast and/or ovarian cancer: no Is there a family history of uterine cancer diagnosed at age < 7450, gastrointestinal cancer, urinary tract cancer, family member who is a Personnel officerLynch syndrome-associated carrier: no Is the patient overweight and hypertensive, family history of diabetes, personal history of gestational diabetes, preeclampsia or PCOS: no Is patient over 4255, have PCOS,  family history of premature CHD under age 21, diabetes, smoke, have hypertension or peripheral artery disease:  no At any time, has a partner hit, kicked or otherwise hurt or frightened you?: no Over the past 2 weeks, have you felt down, depressed or hopeless?: no Over the past 2 weeks, have you felt little interest or pleasure in doing things?:no   Gynecologic History Patient's last menstrual period was 04/14/2017. Contraception: none Last Pap: n/a. Results were: n/a Last mammogram: n/a. Results were: n/a  Obstetric History OB History  Gravida Para Term Preterm AB Living  0 0 0 0 0 0  SAB TAB Ectopic Multiple Live Births  0 0 0 0 0        History reviewed. No pertinent past medical history.  History reviewed. No pertinent surgical history.   Current Outpatient Medications:  .  Norethindrone-Ethinyl Estradiol-Fe Biphas (LO LOESTRIN FE) 1 MG-10 MCG / 10 MCG tablet, Take 1 tablet daily by mouth. Start taking pills on the first day of period., Disp: 1 Package, Rfl: 11 .  valACYclovir (VALTREX) 500 MG tablet, Take 1 tablet (500 mg total) by mouth daily. (Patient not taking: Reported on 05/31/2017), Disp: 30 tablet, Rfl: 4 No Known Allergies  Social History   Tobacco Use  . Smoking status: Never Smoker  . Smokeless tobacco: Never Used  Substance Use Topics  . Alcohol use:  No    Family History  Problem Relation Age of Onset  . Cancer Maternal Grandmother   . Kidney disease Maternal Grandfather       Review of Systems  Constitutional: negative for fatigue and weight loss Respiratory: negative for cough and wheezing Cardiovascular: negative for chest pain, fatigue and palpitations Gastrointestinal: negative for abdominal pain and change in bowel habits Musculoskeletal:negative for myalgias Neurological: negative for gait problems and tremors Behavioral/Psych: negative for abusive relationship, depression Endocrine: negative for temperature intolerance    Genitourinary:positive for vaginal discharge Integument/breast: negative for breast lump, breast tenderness, nipple discharge and skin lesion(s)    Objective:       BP 136/86   Ht 5\' 5"  (1.651 m)   Wt 157 lb (71.2 kg)   LMP 04/14/2017   BMI 26.13 kg/m  General:   alert  Skin:   no rash or abnormalities  Lungs:   clear to auscultation bilaterally  Heart:   regular rate and rhythm, S1, S2 normal, no murmur, click, rub or gallop  Breasts:   normal without suspicious masses, skin or nipple changes or axillary nodes  Abdomen:  normal findings: no organomegaly, soft, non-tender and no hernia  Pelvis:  External genitalia: normal general appearance Urinary system: urethral meatus normal and bladder without fullness, nontender Vaginal: normal without tenderness, induration or masses Cervix: normal appearance Adnexa: normal bimanual exam Uterus: anteverted and non-tender, normal size   Lab Review Urine pregnancy test Labs reviewed yes Radiologic studies reviewed no  50% of 20 min visit spent on counseling and coordination of care.    Assessment:     1. Encounter for gynecological examination with Papanicolaou smear of cervix Rx: - Cytology - PAP  2. Vaginal discharge Rx: - Cervicovaginal ancillary only  3. Missed period Rx: - POCT urine pregnancy  4. Encounter for surveillance of  contraceptive pills Rx: - Norethindrone-Ethinyl Estradiol-Fe Biphas (LO LOESTRIN FE) 1 MG-10 MCG / 10 MCG tablet; Take 1 tablet daily by mouth. Start taking pills on the first day of period.  Dispense: 1 Package; Refill: 11   Plan:    Education reviewed: calcium supplements, depression evaluation, low fat, low cholesterol diet, safe sex/STD prevention, self breast exams and weight bearing exercise. Contraception: OCP (estrogen/progesterone). Follow up in: 1 year.   Meds ordered this encounter  Medications  . Norethindrone-Ethinyl Estradiol-Fe Biphas (LO LOESTRIN FE) 1 MG-10 MCG / 10 MCG tablet    Sig: Take 1 tablet daily by mouth. Start taking pills on the first day of period.    Dispense:  1 Package    Refill:  11   Orders Placed This Encounter  Procedures  . POCT urine pregnancy

## 2017-05-31 NOTE — Progress Notes (Signed)
Pt c/o vaginal discharge with odor no itching. Pt requests UPT d/t missed period.

## 2017-06-03 ENCOUNTER — Other Ambulatory Visit: Payer: Self-pay | Admitting: Obstetrics

## 2017-06-03 DIAGNOSIS — B3731 Acute candidiasis of vulva and vagina: Secondary | ICD-10-CM

## 2017-06-03 DIAGNOSIS — B373 Candidiasis of vulva and vagina: Secondary | ICD-10-CM

## 2017-06-03 LAB — CERVICOVAGINAL ANCILLARY ONLY
BACTERIAL VAGINITIS: NEGATIVE
CANDIDA VAGINITIS: POSITIVE — AB
CHLAMYDIA, DNA PROBE: NEGATIVE
Neisseria Gonorrhea: NEGATIVE
TRICH (WINDOWPATH): NEGATIVE

## 2017-06-03 LAB — CYTOLOGY - PAP: Diagnosis: NEGATIVE

## 2017-06-03 MED ORDER — FLUCONAZOLE 150 MG PO TABS: 150.0000 mg | ORAL_TABLET | Freq: Once | ORAL | 0 refills | Status: AC

## 2017-06-04 ENCOUNTER — Telehealth: Payer: Self-pay

## 2017-06-04 NOTE — Telephone Encounter (Signed)
Advised of results and rx sent 

## 2019-06-14 ENCOUNTER — Ambulatory Visit (HOSPITAL_COMMUNITY)
Admission: EM | Admit: 2019-06-14 | Discharge: 2019-06-14 | Disposition: A | Discharge status: Home / Self Care | Payer: BC Managed Care – PPO | Attending: Family Medicine | Admitting: Family Medicine

## 2019-06-14 ENCOUNTER — Other Ambulatory Visit: Payer: Self-pay

## 2019-06-14 ENCOUNTER — Encounter (HOSPITAL_COMMUNITY): Payer: Self-pay

## 2019-06-14 DIAGNOSIS — S92524A Nondisplaced fracture of medial phalanx of right lesser toe(s), initial encounter for closed fracture: Secondary | ICD-10-CM | POA: Diagnosis not present

## 2019-06-14 NOTE — Discharge Instructions (Addendum)
I believe your toe is fractured ( broken ) Keep it taped and wear fracture shoe until the pain improves This will likely be 2-3 weeks Return as needed Take ibuprofen or tylenol for pain

## 2019-06-14 NOTE — ED Triage Notes (Signed)
Pt states she has walking along in the kitchen and stub her toe. ( right foot)
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
## 2020-03-11 ENCOUNTER — Ambulatory Visit (HOSPITAL_COMMUNITY)
Admission: EM | Admit: 2020-03-11 | Discharge: 2020-03-11 | Disposition: A | Discharge status: Home / Self Care | Payer: Self-pay | Attending: Family Medicine | Admitting: Family Medicine

## 2020-03-11 ENCOUNTER — Other Ambulatory Visit: Payer: Self-pay

## 2020-03-11 DIAGNOSIS — Z1152 Encounter for screening for COVID-19: Secondary | ICD-10-CM

## 2020-03-11 DIAGNOSIS — Z20822 Contact with and (suspected) exposure to covid-19: Secondary | ICD-10-CM | POA: Insufficient documentation

## 2020-03-11 LAB — SARS CORONAVIRUS 2 (TAT 6-24 HRS): SARS Coronavirus 2: NEGATIVE

## 2020-03-11 NOTE — ED Triage Notes (Signed)
Pt presents for covid testing with no known symptoms. 

## 2020-03-11 NOTE — Discharge Instructions (Signed)
If your Covid-19 test is positive, you will get a phone call from [place] regarding your results. If your Covid-19 test is negative, you will NOT get a phone call from [place] with your results. You may view your results on MyChart. If you do not have a MyChart account, sign up instructions are in your discharge papers. ° °

## 2020-05-29 ENCOUNTER — Ambulatory Visit
Admission: EM | Admit: 2020-05-29 | Discharge: 2020-05-29 | Disposition: A | Discharge status: Home / Self Care | Payer: Self-pay

## 2020-05-29 ENCOUNTER — Other Ambulatory Visit: Payer: Self-pay

## 2020-05-29 ENCOUNTER — Encounter: Payer: Self-pay | Admitting: Emergency Medicine

## 2020-05-29 DIAGNOSIS — S0083XA Contusion of other part of head, initial encounter: Secondary | ICD-10-CM

## 2020-05-29 DIAGNOSIS — H1132 Conjunctival hemorrhage, left eye: Secondary | ICD-10-CM

## 2020-05-29 DIAGNOSIS — S0992XA Unspecified injury of nose, initial encounter: Secondary | ICD-10-CM
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
## 2020-05-29 NOTE — Discharge Instructions (Signed)
No alarming signs on exam. Possible nasal fracture. At this time, ice compress for the first 2-3 days, then can switch to heat. Ibuprofen 800mg  three times a day as needed for pain. Follow up with ENT if needed for further evaluation. If having severe headache, nausea/vomiting, vision changes/trouble moving eye, confusion, go to the emergency department for further evaluation.
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
## 2022-03-28 ENCOUNTER — Emergency Department (HOSPITAL_BASED_OUTPATIENT_CLINIC_OR_DEPARTMENT_OTHER): Payer: BC Managed Care – PPO

## 2022-03-28 ENCOUNTER — Emergency Department (HOSPITAL_BASED_OUTPATIENT_CLINIC_OR_DEPARTMENT_OTHER)
Admission: EM | Admit: 2022-03-28 | Discharge: 2022-03-28 | Disposition: A | Discharge status: Home / Self Care | Payer: BC Managed Care – PPO | Source: Home / Self Care | Attending: Emergency Medicine | Admitting: Emergency Medicine

## 2022-03-28 ENCOUNTER — Other Ambulatory Visit: Payer: Self-pay

## 2022-03-28 ENCOUNTER — Emergency Department (HOSPITAL_BASED_OUTPATIENT_CLINIC_OR_DEPARTMENT_OTHER)
Admission: EM | Admit: 2022-03-28 | Discharge: 2022-03-28 | Disposition: A | Discharge status: Home / Self Care | Payer: BC Managed Care – PPO | Attending: Emergency Medicine | Admitting: Emergency Medicine

## 2022-03-28 ENCOUNTER — Encounter (HOSPITAL_BASED_OUTPATIENT_CLINIC_OR_DEPARTMENT_OTHER): Payer: Self-pay

## 2022-03-28 DIAGNOSIS — R0981 Nasal congestion: Secondary | ICD-10-CM

## 2022-03-28 DIAGNOSIS — Z20822 Contact with and (suspected) exposure to covid-19: Secondary | ICD-10-CM | POA: Insufficient documentation

## 2022-03-28 DIAGNOSIS — H6690 Otitis media, unspecified, unspecified ear: Secondary | ICD-10-CM

## 2022-03-28 DIAGNOSIS — R519 Headache, unspecified: Secondary | ICD-10-CM | POA: Insufficient documentation

## 2022-03-28 DIAGNOSIS — H6692 Otitis media, unspecified, left ear: Secondary | ICD-10-CM | POA: Insufficient documentation

## 2022-03-28 DIAGNOSIS — J069 Acute upper respiratory infection, unspecified: Secondary | ICD-10-CM | POA: Insufficient documentation

## 2022-03-28 LAB — COMPREHENSIVE METABOLIC PANEL
ALT: 21 U/L (ref 0–44)
AST: 34 U/L (ref 15–41)
Albumin: 3.3 g/dL — ABNORMAL LOW (ref 3.5–5.0)
Alkaline Phosphatase: 52 U/L (ref 38–126)
Anion gap: 9 (ref 5–15)
BUN: 7 mg/dL (ref 6–20)
CO2: 26 mmol/L (ref 22–32)
Calcium: 8.5 mg/dL — ABNORMAL LOW (ref 8.9–10.3)
Chloride: 100 mmol/L (ref 98–111)
Creatinine, Ser: 0.8 mg/dL (ref 0.44–1.00)
GFR, Estimated: >60 mL/min (ref >60)
Glucose, Bld: 135 mg/dL — ABNORMAL HIGH (ref 70–99)
Potassium: 2.9 mmol/L — ABNORMAL LOW (ref 3.5–5.1)
Sodium: 135 mmol/L (ref 135–145)
Total Bilirubin: 0.6 mg/dL (ref 0.3–1.2)
Total Protein: 7.4 g/dL (ref 6.5–8.1)

## 2022-03-28 LAB — RESPIRATORY PANEL BY PCR

## 2022-03-28 LAB — URINALYSIS, ROUTINE W REFLEX MICROSCOPIC
Bilirubin Urine: NEGATIVE
Glucose, UA: NEGATIVE mg/dL
Ketones, ur: NEGATIVE mg/dL
Nitrite: NEGATIVE
Protein, ur: 30 mg/dL — AB
Specific Gravity, Urine: 1.015 (ref 1.005–1.030)
pH: 6 (ref 5.0–8.0)

## 2022-03-28 LAB — CBC WITH DIFFERENTIAL/PLATELET
Abs Immature Granulocytes: 0.07 10*3/uL (ref 0.00–0.07)
Basophils Absolute: 0 10*3/uL (ref 0.0–0.1)
Basophils Relative: 0 %
Eosinophils Absolute: 0 10*3/uL (ref 0.0–0.5)
Eosinophils Relative: 0 %
HCT: 32.3 % — ABNORMAL LOW (ref 36.0–46.0)
Hemoglobin: 11.2 g/dL — ABNORMAL LOW (ref 12.0–15.0)
Immature Granulocytes: 1 %
Lymphocytes Relative: 12 %
Lymphs Abs: 1.6 10*3/uL (ref 0.7–4.0)
MCH: 29.7 pg (ref 26.0–34.0)
MCHC: 34.7 g/dL (ref 30.0–36.0)
MCV: 85.7 fL (ref 80.0–100.0)
Monocytes Absolute: 0.8 10*3/uL (ref 0.1–1.0)
Monocytes Relative: 7 %
Neutro Abs: 10 10*3/uL — ABNORMAL HIGH (ref 1.7–7.7)
Neutrophils Relative %: 80 %
Platelets: 242 10*3/uL (ref 150–400)
RBC: 3.77 MIL/uL — ABNORMAL LOW (ref 3.87–5.11)
RDW: 12.1 % (ref 11.5–15.5)
WBC: 12.5 10*3/uL — ABNORMAL HIGH (ref 4.0–10.5)
nRBC: 0 % (ref 0.0–0.2)

## 2022-03-28 LAB — SARS CORONAVIRUS 2 BY RT PCR: SARS Coronavirus 2 by RT PCR: NEGATIVE

## 2022-03-28 LAB — LACTIC ACID, PLASMA
Lactic Acid, Venous: 2.1 mmol/L (ref 0.5–1.9)
Lactic Acid, Venous: 1 mmol/L (ref 0.5–1.9)

## 2022-03-28 LAB — URINALYSIS, MICROSCOPIC (REFLEX)

## 2022-03-28 LAB — PREGNANCY, URINE: Preg Test, Ur: NEGATIVE

## 2022-03-28 MED ORDER — FLUTICASONE PROPIONATE 50 MCG/ACT NA SUSP: 1.0000 | Freq: Every day | NASAL | 2 refills | Status: DC

## 2022-03-28 MED ORDER — SODIUM CHLORIDE 0.9 % IV BOLUS
1000.0000 mL | Freq: Once | INTRAVENOUS | Status: AC
  Administered 2022-03-28: 1000 mL via INTRAVENOUS

## 2022-03-28 MED ORDER — DIPHENHYDRAMINE HCL 50 MG/ML IJ SOLN
25.0000 mg | Freq: Once | INTRAMUSCULAR | Status: AC
  Administered 2022-03-28: 25 mg via INTRAVENOUS
  Filled 2022-03-28: qty 1

## 2022-03-28 MED ORDER — OXYMETAZOLINE HCL 0.05 % NA SOLN
1.0000 | Freq: Once | NASAL | Status: AC
  Administered 2022-03-28: 1 via NASAL
  Filled 2022-03-28: qty 30

## 2022-03-28 MED ORDER — PROCHLORPERAZINE MALEATE 10 MG PO TABS
10.0000 mg | ORAL_TABLET | Freq: Two times a day (BID) | ORAL | 0 refills | Status: DC | PRN
Start: 2022-03-28 — End: 2022-06-29

## 2022-03-28 MED ORDER — AMOXICILLIN-POT CLAVULANATE 875-125 MG PO TABS
1.0000 | ORAL_TABLET | Freq: Once | ORAL | Status: AC
  Administered 2022-03-28: 1 via ORAL
  Filled 2022-03-28: qty 1

## 2022-03-28 MED ORDER — AMOXICILLIN-POT CLAVULANATE 875-125 MG PO TABS
1.0000 | ORAL_TABLET | Freq: Two times a day (BID) | ORAL | 0 refills | Status: DC
Start: 2022-03-28 — End: 2022-06-29

## 2022-03-28 MED ORDER — KETOROLAC TROMETHAMINE 15 MG/ML IJ SOLN
15.0000 mg | Freq: Once | INTRAMUSCULAR | Status: AC
  Administered 2022-03-28: 15 mg via INTRAVENOUS
  Filled 2022-03-28: qty 1

## 2022-03-28 MED ORDER — LACTATED RINGERS IV BOLUS
1000.0000 mL | Freq: Once | INTRAVENOUS | Status: AC
  Administered 2022-03-28: 1000 mL via INTRAVENOUS

## 2022-03-28 MED ORDER — PROCHLORPERAZINE EDISYLATE 10 MG/2ML IJ SOLN
5.0000 mg | Freq: Once | INTRAMUSCULAR | Status: AC
  Administered 2022-03-28: 5 mg via INTRAVENOUS
  Filled 2022-03-28: qty 2

## 2022-03-28 MED ORDER — ACETAMINOPHEN 500 MG PO TABS
1000.0000 mg | ORAL_TABLET | Freq: Once | ORAL | Status: AC
  Administered 2022-03-28: 1000 mg via ORAL
  Filled 2022-03-28: qty 2

## 2022-03-28 NOTE — ED Provider Notes (Signed)
[facility] EMERGENCY DEPARTMENT Provider Note   CSN: [id number] Arrival date & time: 03/28/22  0177     History  Chief Complaint  Patient presents with   URI    Ashley Burch is a 26 y.o. female.  26 year old female presented emergency department with mother with complaints of headache, body aches, nasal congestion, mild sore throat and neck pain. Patient reports symptoms started last week Thursday with headache which gradually got worse.  Headache associated with nasal congestion, postnasal drainage, generalized body aches.  Patient denies fever, cough, chest pain, chest tightness.  Patient reports mild shortness of breath with physical activity for past 2 days. Patient works from home.  Denies sick contact. Patient taking ibuprofen as needed for headache with short-term relief in symptoms. Patient is comfortably communicating with complete sentences without acute distress.  The history is provided by the patient. No language interpreter was used.  URI Presenting symptoms: congestion, fatigue and sore throat (On and off)   Presenting symptoms: no cough and no fever   Congestion:    Location:  Nasal   Interferes with sleep: no   Severity:  Mild Associated symptoms: headaches and neck pain        Home Medications Prior to Admission medications   Medication Sig Start Date End Date Taking? Authorizing Provider  Norethindrone-Ethinyl Estradiol-Fe Biphas (LO LOESTRIN FE) 1 MG-10 MCG / 10 MCG tablet Take 1 tablet daily by mouth. Start taking pills on the first day of period. 05/31/17   Brock Bad, MD      Allergies    Patient has no known allergies.    Review of Systems   Review of Systems  Constitutional:  Positive for chills and fatigue. Negative for fever.  HENT:  Positive for congestion, postnasal drip and sore throat (On and off). Negative for trouble swallowing.   Respiratory:  Positive for shortness of breath. Negative for cough.   Cardiovascular:   Negative for chest pain.  Musculoskeletal:  Positive for neck pain.  Neurological:  Positive for headaches. Negative for dizziness.  Psychiatric/Behavioral: Negative.      Physical Exam Updated Vital Signs BP 120/76 (BP Location: Right Arm)   Pulse 98   Temp 98.5 F (36.9 C) (Oral)   Resp 18   Ht 5\' 5"  (1.651 m)   Wt 81.6 kg   LMP 03/24/2022   SpO2 98%   BMI 29.95 kg/m  Physical Exam Vitals and nursing note reviewed.  Constitutional:      Appearance: Normal appearance.  HENT:     Head: Normocephalic and atraumatic.     Nose: Mucosal edema and congestion (Inflamed nasal turbinates bilaterally, right worse than left.) present. No nasal tenderness.     Right Turbinates: Enlarged and swollen.     Right Sinus: No maxillary sinus tenderness or frontal sinus tenderness.     Left Sinus: No maxillary sinus tenderness or frontal sinus tenderness.     Mouth/Throat:     Pharynx: Oropharynx is clear. Uvula midline. No pharyngeal swelling, oropharyngeal exudate or posterior oropharyngeal erythema (No pharyngeal erythema, lesions or exudates appreciated).  Eyes:     Extraocular Movements: Extraocular movements intact.     Conjunctiva/sclera: Conjunctivae normal.     Pupils: Pupils are equal, round, and reactive to light.  Neck:     Comments: No nuchal rigidity.  Full range of motion in the neck. Cardiovascular:     Rate and Rhythm: Normal rate and regular rhythm.  Pulmonary:     Effort:  Pulmonary effort is normal. No respiratory distress.     Breath sounds: No wheezing or rales.  Chest:     Chest wall: No tenderness.  Musculoskeletal:     Cervical back: Normal range of motion and neck supple. No rigidity or tenderness.  Skin:    General: Skin is warm.     Findings: No rash.  Neurological:     Mental Status: She is alert and oriented to person, place, and time. Mental status is at baseline.  Psychiatric:        Mood and Affect: Mood normal.     ED Results / Procedures /  Treatments   Labs (all labs ordered are listed, but only abnormal results are displayed) Labs Reviewed  SARS CORONAVIRUS 2 BY RT PCR    EKG None  Radiology No results found.  Procedures Procedures    Medications Ordered in ED Medications - No data to display  ED Course/ Medical Decision Making/ A&P                           Medical Decision Making 26 year old female presented to emergency department with mother with complaints of headache, nasal congestion, body aches, mild neck pain. Symptoms started 5 days ago and gradually getting worse. Taking OTC Ibuprofen with short term relief in Sx. Patient denies fever, cough, chest pain or chest tightness.  Skin normal color without rashes. Inflamed BL nasal turbinates with nasal congestion. TM dusky BL, no injection or drainage noted. Pharynx mildly irritated from PND. No redness, swelling, lesions, or exudates appreciated. No lymphadenopathy noted. No nuchal rigidity. Lungs CTA. No W/R/R appreciated to auscultation.  After the physical examination low suspicion for acute bacterial sinusitis, acute otitis media, bronchitis or pneumonia.  Presentation consistent with viral URI.  Headache is highly likely from nasal and sinus congestion, unlikely from sinus infection.  Will rule out COVID.  Afrin and antihistamine given in the emergency department.    Patient will use Afrin for 3 days and then switch to fluticasone nasal spray OTC. Mucinex OTC. Increase hydration.   Patient in no distress and overall condition improved here in the ED. Detailed discussions were had with the patient regarding current findings, and need for close f/u with PCP or on call doctor. The patient has been instructed to return immediately if the symptoms worsen in any way for re-evaluation. Patient verbalized understanding and is in agreement with current care plan. All questions answered prior to discharge.            Final Clinical Impression(s) / ED  Diagnoses Final diagnoses:  Viral URI  Nasal congestion  Acute nonintractable headache, unspecified headache type    Rx / DC Orders ED Discharge Orders     None         Reinaldo Raddle, MD 03/28/22 8003    Jacalyn Lefevre, MD 03/28/22 1027

## 2022-03-28 NOTE — ED Provider Notes (Signed)
[facility] EMERGENCY DEPARTMENT Provider Note   CSN: [id number] Arrival date & time: 03/28/22  1647     History Chief Complaint  Patient presents with   Headache   Torticollis    HPI Ashley Burch is a 26 y.o. female presenting for headache.  This is a complex situation.  Patient presented earlier in the day with headache and fever found to be an upper respiratory infection.  She states that as the day has continued, her headache has been more progressive and has escalated to an 8 out of 10, in addition she has began to have left ear pain radiating into her neck.  She also endorses ongoing fever. She has taken no medications since she left this morning. Her mother is at bedside provides significant collateral history.  She is ambulatory and communicative.  She endorses decreased p.o. intake. Without abdominal pain.   Patient's recorded medical, surgical, social, medication list and allergies were reviewed in the Snapshot window as part of the initial history.   Review of Systems   Review of Systems  Constitutional:  Positive for fever. Negative for chills.  HENT:  Negative for ear pain and sore throat.   Eyes:  Negative for pain and visual disturbance.  Respiratory:  Negative for cough and shortness of breath.   Cardiovascular:  Negative for chest pain and palpitations.  Gastrointestinal:  Negative for abdominal pain and vomiting.  Genitourinary:  Negative for dysuria and hematuria.  Musculoskeletal:  Negative for arthralgias and back pain.  Skin:  Negative for color change and rash.  Neurological:  Positive for headaches. Negative for seizures and syncope.  All other systems reviewed and are negative.   Physical Exam Updated Vital Signs BP (!) 98/50   Pulse 97   Temp 98.5 F (36.9 C) (Oral)   Resp 16   LMP 03/24/2022   SpO2 96%  Physical Exam Vitals and nursing note reviewed.  Constitutional:      General: She is not in acute distress.    Appearance: She  is well-developed.  HENT:     Head: Normocephalic and atraumatic.     Right Ear: Tympanic membrane normal.     Ears:     Comments: Patient's left ear is grossly erythematous, canal is grossly irritated.  Appears to have purulence collected behind the left TM Eyes:     Conjunctiva/sclera: Conjunctivae normal.  Neck:     Comments: Patient has pain with neck motion localizing to her lateral muscles of her trapezium and occiput but has no nuchal rigidity or meningismus. Cardiovascular:     Rate and Rhythm: Normal rate and regular rhythm.     Heart sounds: No murmur heard. Pulmonary:     Effort: Pulmonary effort is normal. No respiratory distress.     Breath sounds: Normal breath sounds.  Abdominal:     Palpations: Abdomen is soft.     Tenderness: There is no abdominal tenderness.  Musculoskeletal:        General: No swelling.     Cervical back: Normal range of motion and neck supple.  Skin:    General: Skin is warm and dry.     Capillary Refill: Capillary refill takes less than 2 seconds.  Neurological:     Mental Status: She is alert.  Psychiatric:        Mood and Affect: Mood normal.      ED Course/ Medical Decision Making/ A&P Clinical Course as of 03/29/22 0013  Wed Mar 28, 2022  2027 Reassess [CC]    Clinical Course User Index [CC] Glyn Ade, MD    Procedures Procedures   Medications Ordered in ED Medications  acetaminophen (TYLENOL) tablet 1,000 mg (1,000 mg Oral Given 03/28/22 1719)  sodium chloride 0.9 % bolus 1,000 mL (0 mLs Intravenous Stopped 03/28/22 2119)  ketorolac (TORADOL) 15 MG/ML injection 15 mg (15 mg Intravenous Given 03/28/22 1806)  lactated ringers bolus 1,000 mL ( Intravenous Stopped 03/28/22 1826)  prochlorperazine (COMPAZINE) injection 5 mg (5 mg Intravenous Given 03/28/22 2003)  diphenhydrAMINE (BENADRYL) injection 25 mg (25 mg Intravenous Given 03/28/22 2000)  amoxicillin-clavulanate (AUGMENTIN) 875-125 MG per tablet 1 tablet (1 tablet Oral  Given 03/28/22 2117)    Medical Decision Making:    Ashley Burch is a 26 y.o. female who presented to the ED today with fever and headache detailed above.     Additional history discussed with patient's family/caregivers.  Patient's presentation is complicated by their history of herpes.  Patient placed on continuous vitals and telemetry monitoring while in ED which was reviewed periodically.   Complete initial physical exam performed, notably the patient  was febrile, tachycardic.      Reviewed and confirmed nursing documentation for past medical history, family history, social history.    Initial Assessment:   With the patient's presentation of headache and fever, most likely diagnosis is continued viral upper respiratory infection versus developing otitis media given the localization of patient's findings to her left ear and pain with movement of the left ear.  Does not appear to have mastoiditis at this time given lack of TMJ pain and lack of point tenderness over the left mastoid.  She does have pain with movement of the left ear. Other diagnoses were considered including (but not limited to) meningitis, appendicitis, cholecystitis, sepsis. These are considered less likely due to history of present illness and physical exam findings.   This is most consistent with an acute life/limb threatening illness complicated by underlying chronic conditions. Concerning meningitis or encephalitis, positive risk factors include patient's history of herpes infection in the past..  Negative risk factors include, duration of symptoms greater than 7 days, intermittent nature of patient's symptoms, improvement with medications.  Initial Plan:  Screening labs including CBC and Metabolic panel to evaluate for infectious or metabolic etiology of disease.  Urinalysis with reflex culture ordered to evaluate for UTI or relevant urologic/nephrologic pathology.  CXR to evaluate for structural/infectious  intrathoracic pathology.  See he had to evaluate for structural intracranial etiology as the cause of patient's symptoms EKG to evaluate for cardiac pathology. Strongly considered LP given her positive risk factors though when weighed against her negative risk factors: The risk benefits are less clear.  Offered immediate LP versus reassessment of symptoms and need for LP after medications and patient and family requested medication administration and reassessment rather than immediate LP. Objective evaluation as below reviewed with plan for close reassessment  Initial Study Results:   Laboratory  Multiple abnormalities, leukocytosis, lactic acid elevation clearing after 2 L IV fluid.  Hypokalemia.  EKG EKG was reviewed independently. Rate, rhythm, axis, intervals all examined and without medically relevant abnormality. ST segments without concerns for elevations.    Radiology  All images reviewed independently. Agree with radiology report at this time.   CT HEAD WO CONTRAST ( )  Result Date: 03/28/2022 CLINICAL DATA:  Headache. EXAM: CT HEAD WITHOUT CONTRAST TECHNIQUE: Contiguous axial images were obtained from the base of the skull through the vertex without intravenous contrast. RADIATION  DOSE REDUCTION: This exam was performed according to the departmental dose-optimization program which includes automated exposure control, adjustment of the mA and/or kV according to patient size and/or use of iterative reconstruction technique. COMPARISON:  None Available. FINDINGS: Brain: The ventricles are normal in size and configuration. No extra-axial fluid collections are identified. The gray-white differentiation is maintained. No CT findings for acute hemispheric infarction or intracranial hemorrhage. No mass lesions. The brainstem and cerebellum are normal. Vascular: No hyperdense vessels or obvious aneurysm. Skull: No acute skull fracture.  No bone lesion. Sinuses/Orbits: The paranasal sinuses and  mastoid air cells are clear. The globes are intact. Other: No scalp lesions, laceration or hematoma. IMPRESSION: No acute intracranial findings or mass lesions. Electronically Signed   By: Rudie Meyer M.D.   On: 03/28/2022 18:33   DG Chest Portable 1 View  Result Date: 03/28/2022 CLINICAL DATA:  Fever, neck pain and headache. EXAM: PORTABLE CHEST 1 VIEW COMPARISON:  Chest radiograph 07/27/2010 FINDINGS: The cardiomediastinal contours are within normal limits. The lungs are clear. No pneumothorax or pleural effusion. No acute finding in the visualized skeleton. IMPRESSION: No acute cardiopulmonary process. Electronically Signed   By: Emmaline Kluver M.D.   On: 03/28/2022 17:44     Final Assessment and Plan:   Patient had an extensive stay in the emergency department of approximately 4 hours.  Patient was empirically treated with multiple medications for her symptoms with near complete symptomatic resolution after 4-1/2 hours in the emergency department.  Headache had decreased to a 1 out of 10, she has movement of her neck and fever has completely resolved.  She has multiple pending studies including a viral respiratory pathogen panel, a EBV panel and she is currently being treated for otitis of the left ear which appears to be causing at least some of her symptoms. Again I sat down with the patient and the mother in the room and discussed options.  Again I offered lumbar puncture versus observational care.  I was intentional to not bias the patient and family but present risks of procedure including bleeding or worsening headache versus risks of missed meningitis including progressive worsening infection.  Patient and mother talked in private and ultimately they declined lumbar puncture at this time.  Given this declination, we discussed IV antibiotics and empiric treatment for sepsis. Again, given her symptomatic resolution, they would prefer outpatient discharge with treatment for otitis and close  reassessment by PCP or by returning to the emergency department within 24 hours.  She is going to stay with her mother tonight for close reassessment.  Will prescribe Augmentin and Compazine for her nausea which seems to have improved her symptoms and patient is otherwise stable for discharge at this time into the care of the family members.  Patient discharged with no further acute events.  Clinical Impression:  1. Acute nonintractable headache, unspecified headache type   2. Upper respiratory tract infection, unspecified type   3. Acute otitis media, unspecified otitis media type      Discharge   Final Clinical Impression(s) / ED Diagnoses Final diagnoses:  Acute nonintractable headache, unspecified headache type  Upper respiratory tract infection, unspecified type  Acute otitis media, unspecified otitis media type    Rx / DC Orders ED Discharge Orders          Ordered    amoxicillin-clavulanate (AUGMENTIN) 875-125 MG tablet  Every 12 hours        03/28/22 2101    prochlorperazine (COMPAZINE) 10 MG tablet  2 times daily PRN        03/28/22 2101              Glyn Ade, MD 03/29/22 [phone]

## 2022-03-28 NOTE — ED Notes (Signed)
Patient is in ct 

## 2022-03-28 NOTE — ED Notes (Signed)
Patient states that she is having pain in her head and neck . States that has had pain for several days

## 2022-03-28 NOTE — ED Triage Notes (Signed)
Pt c/o ongoing neck pain/stiffness and headache x 1 week. States she was seen earlier today and dx with URI. Pt states she did not tell the provider that she was having an active herpes outbreak and pt is concerned for "herpes meningitis" Returned for further evaluation. Last dose of ibuprofen 5 am this morning.

## 2022-03-28 NOTE — Discharge Instructions (Signed)
Use Flonase 1 squirt each nostril once daily for 4 weeks. May use Mucinex over-the-counter to help with the congestion.  Ibuprofen/Tylenol as needed for headaches.  Increase hydration. Follow-up with PCP in 2 days if symptoms do not improve or return to emergency department if symptoms worsens.

## 2022-03-28 NOTE — ED Notes (Signed)
Blood cultures collected 1730 and 1717

## 2022-03-28 NOTE — ED Notes (Signed)
Ice pack applied to posterior neck

## 2022-03-28 NOTE — Discharge Instructions (Addendum)
You were seen today for headache and fever. We did not identify any emergent cause for your symptoms. Your evaluation is most consistent with viral upper respiratory illness versus developing left-sided otitis media given your localizing symptoms to this location..  We have discussed in depth your risk for meningitis given your history of herpes.  Based upon multiple reasons, I do not believe that this is representing meningitis at this time. You have been sick for 7 days with intermittent nature and gradual progression, while you have neck pain, you do not have meningismus.  Your blood pressure, fever, heart rate have all responded to our treatments here in the emergency department with normalization of your vital signs and improvement of your lactic acidosis.  I do believe that many of your symptoms were driven by severe dehydration due to decreased intake by mouth. We have discussed observation, transfer, lumbar puncture and you feel comfortable with outpatient observation at this time.  This is reasonable but it is critical that you return if your symptoms worsen.  Plan and next steps:   The following may be helpful in managing your symptoms:  Pain/Fever- Adult Tylenol dosing:  650 mg orally every 4 to 6 hours as needed, MAX: 3250 mg/24 hours   (Extra-strength) 1000 mg orally every 6 hours as needed; MAX: 3000 mg/24 hours   Do not use if you have liver disease. Read the label on the bottle.   Pain/Fever- Adult Ibuprofen Dosing  200 to 400 mg orally every 4 to 6 hours as needed; MAX 1200 mg/day; do not take longer than 10 days   Do not use if you have kidney disease. Read the label on the bottle  Nausea- You have been prescribed Compazine Take 10mg  by mouth as needed every 6 hours approximately 30 minutes before eating.  Findings:  You may see all of your lab and imaging results utilizing our online portal! Look in this document or ask a team member for your mychart* access information. The  most notable results have additionally been verbally communicated with you and your bedside family.    Follow-up Plan:   Follow up with the patient's normal primary care provider for monitoring of this condition within 48 hours.   Signs/Symptoms that would warrant return to the ED:  Please return to the ED if you experience worsening of symptoms or any abrupt changes in your health. Standard of care precautions for your chief complaint have already been verbally communicated with you. Always be on alert for fevers, chills, shortness of breath, chest pains, or sudden changes that warrant immediate evaluation.    Thank you for allowing to be a part of you and your families' care.   [place] MD

## 2022-03-28 NOTE — ED Triage Notes (Signed)
C/o headache, bodyaches, chills, sore throat since Thursday. Last took ibuprofen at 0500

## 2022-03-29 LAB — EPSTEIN-BARR VIRUS NUCLEAR ANTIGEN ANTIBODY, IGG: EBV NA IgG: 394 U/mL — ABNORMAL HIGH (ref 0.0–17.9)

## 2022-04-02 LAB — CULTURE, BLOOD (ROUTINE X 2)
Culture: NO GROWTH
Culture: NO GROWTH
Special Requests: ADEQUATE

## 2022-04-06 ENCOUNTER — Ambulatory Visit: Payer: Self-pay | Admitting: Internal Medicine

## 2022-05-18 ENCOUNTER — Ambulatory Visit: Payer: Self-pay | Admitting: Internal Medicine

## 2022-06-29 ENCOUNTER — Ambulatory Visit (INDEPENDENT_AMBULATORY_CARE_PROVIDER_SITE_OTHER): Payer: BC Managed Care – PPO | Admitting: Internal Medicine

## 2022-06-29 ENCOUNTER — Encounter: Payer: Self-pay | Admitting: Internal Medicine

## 2022-06-29 VITALS — BP 102/62 | HR 78 | Temp 98.0°F | Resp 18 | Ht 66.0 in | Wt 178.9 lb

## 2022-06-29 DIAGNOSIS — L501 Idiopathic urticaria: Secondary | ICD-10-CM | POA: Diagnosis not present

## 2022-06-29 DIAGNOSIS — J3089 Other allergic rhinitis: Secondary | ICD-10-CM

## 2022-06-29 MED ORDER — EPINEPHRINE 0.3 MG/0.3ML IJ SOAJ: 0.3000 mg | INTRAMUSCULAR | 1 refills | Status: AC | PRN

## 2022-06-29 NOTE — Progress Notes (Signed)
New Patient Note  RE: Ashley Burch MRN: [id number] DOB: Jul 10, 1996 Date of Office Visit: 06/29/2022  Consult requested by: Vicenta Aly, [place] Primary care provider: Vicenta Aly, FNP  Chief Complaint: Urticaria  History of Present Illness: I had the pleasure of seeing Ashley Burch for initial evaluation at the [facility] on 06/29/2022. She is a 26 y.o. female, who is referred here by Vicenta Aly, [place] for the evaluation of urticaria .  History obtained from patient, chart review.  She reports random urticaria that will occur 2 times per year.  Started around 2 years ago.  They will last 24 hours and are associated with dermatographism.  She will treat with benadryl and claritin.  Denies any systemic symptoms.    Denies any associated food, medication or contact exposure.  Denies any association with sun, cold, heat, exercise, pressure, water or vibration exposure.  Lesions are not painful or fixed.  Denies any associated fevers or arthritis.  Chronic rhinitis: started as a young child  Symptoms include: nasal congestion, rhinorrhea, post nasal drainage, sneezing, watery eyes, itchy eyes, and itchy nose  Occurs seasonally-springtime  Potential triggers: pollen season  Treatments tried: claritin (with good control)  Previous allergy testing: no History of reflux/heartburn: no History of chronic sinusitis or sinus surgery: no Nonallergic triggers:  denies      Assessment and Plan: Ashley Burch is a 26 y.o. female with:  Most likely idiopathic however given discrete episodic and infrequent nature I do have concern for an IgE mediated process.  Out of abundance precaution prescribed EpiPen autoinjector.  No clear trigger based on history.  Perhaps contact urticaria certain pollens or animal exposure and asked patient to track this better.   Idiopathic urticaria - Plan: Allergy Test, Interdermal Allergy Test, Alpha-Gal Panel, Sed Rate (ESR), C-reactive protein,  Tryptase, Thyroid antibodies, TSH, CBC With Diff/Platelet, CMP14+EGFR  Other allergic rhinitis Plan: Patient Instructions   Urticaria  -Given discrete episodes I am more concerned that these are representative of an IgE mediated reaction versus chronic spontaneous urticaria -No indication for preventative antihistamines as you are only having symptoms twice a year -For any subsequent reactions start Zyrtec 10 mg twice a day continue until symptom-free -Write down anything you came in contact with for 2 hours prior to onset of symptoms -For skin plus other symptoms use EpiPen and follow allergy action plan -We will get chronic hives labs for further investigation -Allergy testing today negative to all foods, positive to grass pollen, weed pollen, molds, cat, dog, mouse -Avoidance measures these environmental allergies  Follow up: 4 weeks   Thank you so much for letting me partake in your care today.  Don't hesitate to reach out if you have any additional concerns!  Roney Marion, MD  [facility] [place] [place]  Reducing Pollen Exposure  The American Academy of Allergy, Asthma and Immunology suggests the following steps to reduce your exposure to pollen during allergy seasons.    Do not hang sheets or clothing out to dry; pollen may collect on these items. Do not mow lawns or spend time around freshly cut grass; mowing stirs up pollen. Keep windows closed at night.  Keep car windows closed while driving. Minimize morning activities outdoors, a time when pollen counts are usually at their highest. Stay indoors as much as possible when pollen counts or humidity is high and on windy days when pollen tends to remain in the air longer. Use air conditioning when possible.  Many air conditioners have filters that trap the pollen spores. Use a HEPA room air filter to remove pollen form the indoor air you breathe.  Control of Mold Allergen   Mold and fungi can grow on a  variety of surfaces provided certain temperature and moisture conditions exist.  Outdoor molds grow on plants, decaying vegetation and soil.  The major outdoor mold, Alternaria and Cladosporium, are found in very high numbers during hot and dry conditions.  Generally, a late Summer - Fall peak is seen for common outdoor fungal spores.  Rain will temporarily lower outdoor mold spore count, but counts rise rapidly when the rainy period ends.  The most important indoor molds are Aspergillus and Penicillium.  Dark, humid and poorly ventilated basements are ideal sites for mold growth.  The next most common sites of mold growth are the bathroom and the kitchen.  Outdoor (Seasonal) Mold Control  Positive outdoor molds via skin testing: Cladosporium, Bipolaris (Helminthsporium), Drechslera (Curvalaria), and Mucor  Use air conditioning and keep windows closed Avoid exposure to decaying vegetation. Avoid leaf raking. Avoid grain handling. Consider wearing a face mask if working in moldy areas.    Indoor (Perennial) Mold Control   Positive indoor molds via skin testing: Aspergillus, Penicillium, Fusarium, Aureobasidium (Pullulara), and Rhizopus  Maintain humidity below 50%. Clean washable surfaces with 5% bleach solution. Remove sources e.g. contaminated carpets.  Control of Dog or Cat Allergen  Avoidance is the best way to manage a dog or cat allergy. If you have a dog or cat and are allergic to dog or cats, consider removing the dog or cat from the home. If you have a dog or cat but don't want to find it a new home, or if your family wants a pet even though someone in the household is allergic, here are some strategies that may help keep symptoms at bay:  Keep the pet out of your bedroom and restrict it to only a few rooms. Be advised that keeping the dog or cat in only one room will not limit the allergens to that room. Don't pet, hug or kiss the dog or cat; if you do, wash your hands with soap  and water. High-efficiency particulate air (HEPA) cleaners run continuously in a bedroom or living room can reduce allergen levels over time. Regular use of a high-efficiency vacuum cleaner or a central vacuum can reduce allergen levels. Giving your dog or cat a bath at least once a week can reduce airborne allergen.      Meds ordered this encounter  Medications   EPINEPHrine 0.3 mg/0.3 mL IJ SOAJ injection    Sig: Inject 0.3 mg into the muscle as needed.    Dispense:  1 each    Refill:  1   Lab Orders         Alpha-Gal Panel         Sed Rate (ESR)         C-reactive protein         Tryptase         Thyroid antibodies         TSH         CBC With Diff/Platelet         CMP14+EGFR      Other allergy screening: Asthma: no Rhino conjunctivitis: no Food allergy: no Medication allergy: no Hymenoptera allergy: no Urticaria: no Eczema:yes History of recurrent infections suggestive of immunodeficency: no  Diagnostics:  Skin Testing: Environmental allergy panel and select foods.  Food allergy testing was negative  Skin prick testing positive to grass pollen, weed pollen,, mold, cat, mouse Intradermal testing positive to mold mix mixed 3 movements for Results interpreted by myself and discussed with patient/family.  Airborne Adult Perc - 06/29/22 1030     Time Antigen Placed 1025    Allergen Manufacturer Greer    Location Back    Number of Test 59    1. Control-Buffer 50% Glycerol Negative    2. Control-Histamine 1 mg/ml 4+    3. Albumin saline Negative    4. Trezevant Negative    5. [place] Negative    6. Johnson 3+    7. [place] Blue Negative    8. Meadow Fescue Negative    9. Perennial Rye Negative    10. Sweet Vernal 3+    11. Timothy Negative    12. Cocklebur Negative    13. Burweed Marshelder 3+    14. Ragweed, short Negative    15. Ragweed, Giant Negative    16. Plantain,  English Negative    17. Lamb's Quarters Negative    18. Sheep Sorrell Negative    19.  Rough Pigweed 3+    20. Marsh Elder, Rough Negative    21. Mugwort, Common Negative    22. Ash mix Negative    23. Birch mix Negative    24. Beech American Negative    25. Box, Elder Negative    26. Cedar, red Negative    27. Cottonwood, [place] Negative    28. Elm mix Negative    29. Hickory Negative    30. Maple mix Negative    31. Oak, [place] mix Negative    32. Pecan Pollen Negative    33. Pine mix Negative    34. Sycamore Eastern Negative    35. [place], Black Pollen Negative    36. Alternaria alternata Negative    37. Cladosporium Herbarum 3+    38. Aspergillus mix Negative    39. Penicillium mix Negative    40. Bipolaris sorokiniana (Helminthosporium) Negative    41. Drechslera spicifera (Curvularia) Negative    42. Mucor plumbeus Negative    43. Fusarium moniliforme Negative    44. Aureobasidium pullulans (pullulara) Negative    45. Rhizopus oryzae Negative    46. Botrytis cinera Negative    47. Epicoccum nigrum Negative    48. Phoma betae Negative    49. Candida Albicans Negative    50. Trichophyton mentagrophytes Negative    51. Mite, D Farinae  5,000 AU/ml Negative    52. Mite, D Pteronyssinus  5,000 AU/ml Negative    53. Cat Hair 10,000 BAU/ml 4+    54.  Dog Epithelia Negative    55. Mixed Feathers Negative    56. Horse Epithelia Negative    57. Cockroach, German Negative    58. Mouse 4+    59. Tobacco Leaf Negative             Food Perc - 06/29/22 1030       Test Information   Time Antigen Placed 1025    Allergen Manufacturer Greer    Location Back    Number of allergen test 10      Food   1. Peanut Negative    2. Soybean food Negative    3. Wheat, whole Negative    4. Sesame Negative    5. Milk, cow Negative    6. Egg White, chicken Negative    7. Casein Negative  8. Shellfish mix Negative    9. Fish mix Negative    10. Cashew Negative             Intradermal - 06/29/22 1050     Time Antigen Placed 1050    Allergen  Manufacturer Lavella Hammock    Location Back;Arm    Number of Test 9    Control Negative    [place] Negative    Ragweed mix Negative    Tree mix Negative    Mold 2 4+    Mold 3 4+    Mold 4 4+    Dog 3+    Mite mix Negative             Past Medical History: Patient Active Problem List   Diagnosis Date Noted   Herpes genitalis in women 11/01/2015   Vaginal lesion 10/27/2015   Contraception management 05/26/2015   High risk sexual behavior 05/26/2015   Vaginitis 05/26/2015   Anemia 03/03/2015   Past Medical History:  Diagnosis Date   Urticaria    Past Surgical History: History reviewed. No pertinent surgical history. Medication List:  Current Outpatient Medications  Medication Sig Dispense Refill   EPINEPHrine 0.3 mg/0.3 mL IJ SOAJ injection Inject 0.3 mg into the muscle as needed. 1 each 1   Norethindrone-Ethinyl Estradiol-Fe Biphas (LO LOESTRIN FE) 1 MG-10 MCG / 10 MCG tablet Take 1 tablet daily by mouth. Start taking pills on the first day of period. 1 Package 11   valACYclovir (VALTREX) 500 MG tablet Take by mouth.     No current facility-administered medications for this visit.   Allergies: No Known Allergies Social History: Social History   Socioeconomic History   Marital status: Single    Spouse name: Not on file   Number of children: Not on file   Years of education: Not on file   Highest education level: Not on file  Occupational History   Not on file  Tobacco Use   Smoking status: Never    Passive exposure: Never   Smokeless tobacco: Never  Vaping Use   Vaping Use: Never used  Substance and Sexual Activity   Alcohol use: No   Drug use: No   Sexual activity: Yes    Partners: Male    Birth control/protection: None  Other Topics Concern   Not on file  Social History Narrative   Not on file   Social Determinants of Health   Financial Resource Strain: Not on file  Food Insecurity: Not on file  Transportation Needs: Not on file  Physical  Activity: Not on file  Stress: Not on file  Social Connections: Not on file   Lives in a single-family home, there are no roaches in the house and that is 2 feet off the floor.  The dust mite precautions on bed and pillows.  She is not exposed to fumes, chemicals or dust.  [place] Smoking: She is exposed to cigarette secondhand smoke in the house but not the car. Occupation: Works as a Publishing copy.  Environmental History: Water Damage/mildew in the house:  No  Heating: electric Cooling: central Pet: yes dog without access to bedroom.   Family History: Family History  Problem Relation Age of Onset   Cancer Maternal Grandmother    Kidney disease Maternal Grandfather    Allergic rhinitis Neg Hx    Angioedema Neg Hx    Asthma Neg Hx    Atopy Neg Hx  Eczema Neg Hx    Immunodeficiency Neg Hx    Urticaria Neg Hx      ROS: All others negative except as noted per HPI.   Objective: BP 102/62   Pulse 78   Temp 98 F (36.7 C) (Temporal)   Resp 18   Ht _0  (1.676 m)   Wt 178 lb 14.4 oz (81.1 kg)   SpO2 99%   BMI 28.88 kg/m  Body mass index is 28.88 kg/m.  General Appearance:  Alert, cooperative, no distress, appears stated age  Head:  Normocephalic, without obvious abnormality, atraumatic  Eyes:  Conjunctiva clear, EOM's intact  Nose: Nares normal, normal mucosa, no visible anterior polyps, and septum midline  Throat: Lips, tongue normal; teeth and gums normal, normal posterior oropharynx and no tonsillar exudate  Neck: Supple, symmetrical  Lungs:   clear to auscultation bilaterally, Respirations unlabored, no coughing  Heart:  regular rate and rhythm and no murmur, Appears well perfused  Extremities: No edema  Skin: Skin color, texture, turgor normal, no rashes or lesions on visualized portions of skin  Neurologic: No gross deficits   The plan was reviewed with the patient/family, and all questions/concerned  were addressed.  It was my pleasure to see Ashley Burch today and participate in her care. Please feel free to contact me with any questions or concerns.  Sincerely,  Roney Marion, MD Allergy & Immunology  [facility] office: [phone] [facility] office: [phone]

## 2022-06-29 NOTE — Patient Instructions (Signed)
Urticaria  -Given discrete episodes I am more concerned that these are representative of an IgE mediated reaction versus chronic spontaneous urticaria -No indication for preventative antihistamines as you are only having symptoms twice a year -For any subsequent reactions start Zyrtec 10 mg twice a day continue until symptom-free -Write down anything you came in contact with for 2 hours prior to onset of symptoms -For skin plus other symptoms use EpiPen and follow allergy action plan -We will get chronic hives labs for further investigation -Allergy testing today negative to all foods, positive to grass pollen, weed pollen, molds, cat, dog, mouse -Avoidance measures these environmental allergies  Follow up: 4 weeks   Thank you so much for letting me partake in your care today.  Don't hesitate to reach out if you have any additional concerns!  Ferol Luz, MD  [facility] [place] [place]  Reducing Pollen Exposure  The American Academy of Allergy, Asthma and Immunology suggests the following steps to reduce your exposure to pollen during allergy seasons.    Do not hang sheets or clothing out to dry; pollen may collect on these items. Do not mow lawns or spend time around freshly cut grass; mowing stirs up pollen. Keep windows closed at night.  Keep car windows closed while driving. Minimize morning activities outdoors, a time when pollen counts are usually at their highest. Stay indoors as much as possible when pollen counts or humidity is high and on windy days when pollen tends to remain in the air longer. Use air conditioning when possible.  Many air conditioners have filters that trap the pollen spores. Use a HEPA room air filter to remove pollen form the indoor air you breathe.  Control of Mold Allergen   Mold and fungi can grow on a variety of surfaces provided certain temperature and moisture conditions exist.  Outdoor molds grow on plants, decaying vegetation  and soil.  The major outdoor mold, Alternaria and Cladosporium, are found in very high numbers during hot and dry conditions.  Generally, a late Summer - Fall peak is seen for common outdoor fungal spores.  Rain will temporarily lower outdoor mold spore count, but counts rise rapidly when the rainy period ends.  The most important indoor molds are Aspergillus and Penicillium.  Dark, humid and poorly ventilated basements are ideal sites for mold growth.  The next most common sites of mold growth are the bathroom and the kitchen.  Outdoor (Seasonal) Mold Control  Positive outdoor molds via skin testing: Cladosporium, Bipolaris (Helminthsporium), Drechslera (Curvalaria), and Mucor  Use air conditioning and keep windows closed Avoid exposure to decaying vegetation. Avoid leaf raking. Avoid grain handling. Consider wearing a face mask if working in moldy areas.    Indoor (Perennial) Mold Control   Positive indoor molds via skin testing: Aspergillus, Penicillium, Fusarium, Aureobasidium (Pullulara), and Rhizopus  Maintain humidity below 50%. Clean washable surfaces with 5% bleach solution. Remove sources e.g. contaminated carpets.  Control of Dog or Cat Allergen  Avoidance is the best way to manage a dog or cat allergy. If you have a dog or cat and are allergic to dog or cats, consider removing the dog or cat from the home. If you have a dog or cat but don't want to find it a new home, or if your family wants a pet even though someone in the household is allergic, here are some strategies that may help keep symptoms at bay:  Keep the pet out of your bedroom and  restrict it to only a few rooms. Be advised that keeping the dog or cat in only one room will not limit the allergens to that room. Don't pet, hug or kiss the dog or cat; if you do, wash your hands with soap and water. High-efficiency particulate air (HEPA) cleaners run continuously in a bedroom or living room can reduce allergen levels  over time. Regular use of a high-efficiency vacuum cleaner or a central vacuum can reduce allergen levels. Giving your dog or cat a bath at least once a week can reduce airborne allergen.

## 2023-10-10 ENCOUNTER — Institutional Professional Consult (permissible substitution): Payer: BC Managed Care – PPO | Admitting: Plastic Surgery

## 2024-02-20 ENCOUNTER — Institutional Professional Consult (permissible substitution): Admitting: Plastic Surgery
